# Patient Record
Sex: Male | Born: 1965
Health system: Southern US, Community
[De-identification: ages and names within clinical notes are randomized; demographics above are authoritative.]

## PROBLEM LIST (undated history)

## (undated) DIAGNOSIS — I639 Cerebral infarction, unspecified: Secondary | ICD-10-CM

## (undated) DIAGNOSIS — N189 Chronic kidney disease, unspecified: Secondary | ICD-10-CM

## (undated) DIAGNOSIS — I10 Essential (primary) hypertension: Secondary | ICD-10-CM

## (undated) DIAGNOSIS — G43909 Migraine, unspecified, not intractable, without status migrainosus: Secondary | ICD-10-CM

## (undated) DIAGNOSIS — I219 Acute myocardial infarction, unspecified: Secondary | ICD-10-CM

## (undated) DIAGNOSIS — R0600 Dyspnea, unspecified: Secondary | ICD-10-CM

## (undated) HISTORY — PX: HAND SURGERY: SHX662

## (undated) HISTORY — DX: Migraine, unspecified, not intractable, without status migrainosus: G43.909

---

## 2012-01-24 DIAGNOSIS — I639 Cerebral infarction, unspecified: Secondary | ICD-10-CM

## 2012-01-24 HISTORY — DX: Cerebral infarction, unspecified: I63.9

## 2016-01-10 ENCOUNTER — Other Ambulatory Visit: Payer: Self-pay | Admitting: Cardiology

## 2016-01-10 DIAGNOSIS — R079 Chest pain, unspecified: Secondary | ICD-10-CM

## 2016-01-12 ENCOUNTER — Ambulatory Visit (HOSPITAL_COMMUNITY)
Admission: RE | Admit: 2016-01-12 | Discharge: 2016-01-12 | Disposition: A | Discharge status: Home / Self Care | Payer: BLUE CROSS/BLUE SHIELD | Source: Ambulatory Visit | Attending: Cardiology | Admitting: Cardiology

## 2016-01-12 DIAGNOSIS — R079 Chest pain, unspecified: Secondary | ICD-10-CM | POA: Diagnosis present

## 2016-01-12 LAB — LIPID PANEL
CHOL/HDL RATIO: 3 ratio
CHOLESTEROL: 131 mg/dL (ref 0–200)
HDL: 43 mg/dL (ref >40)
LDL Cholesterol: 73 mg/dL (ref 0–99)
TRIGLYCERIDES: 77 mg/dL (ref <150)
VLDL: 15 mg/dL (ref 0–40)

## 2016-01-12 LAB — BASIC METABOLIC PANEL
ANION GAP: 5 (ref 5–15)
BUN: 12 mg/dL (ref 6–20)
CALCIUM: 9.4 mg/dL (ref 8.9–10.3)
CO2: 28 mmol/L (ref 22–32)
CREATININE: 1.49 mg/dL — AB (ref 0.61–1.24)
Chloride: 107 mmol/L (ref 101–111)
GFR, EST NON AFRICAN AMERICAN: 53 mL/min — AB (ref >60)
GLUCOSE: 115 mg/dL — AB (ref 65–99)
Potassium: 4.4 mmol/L (ref 3.5–5.1)
Sodium: 140 mmol/L (ref 135–145)

## 2016-01-12 LAB — HEPATIC FUNCTION PANEL
ALT: 35 U/L (ref 17–63)
AST: 25 U/L (ref 15–41)
Albumin: 4.2 g/dL (ref 3.5–5.0)
Alkaline Phosphatase: 69 U/L (ref 38–126)
BILIRUBIN DIRECT: 0.2 mg/dL (ref 0.1–0.5)
BILIRUBIN INDIRECT: 1.2 mg/dL — AB (ref 0.3–0.9)
TOTAL PROTEIN: 7.2 g/dL (ref 6.5–8.1)
Total Bilirubin: 1.4 mg/dL — ABNORMAL HIGH (ref 0.3–1.2)

## 2016-01-12 MED ORDER — TECHNETIUM TC 99M TETROFOSMIN IV KIT
30.0000 | PACK | Freq: Once | INTRAVENOUS | Status: AC | PRN
  Administered 2016-01-12: 30 via INTRAVENOUS

## 2016-01-12 MED ORDER — TECHNETIUM TC 99M TETROFOSMIN IV KIT
10.0000 | PACK | Freq: Once | INTRAVENOUS | Status: AC | PRN
  Administered 2016-01-12: 10 via INTRAVENOUS

## 2016-01-12 MED ORDER — REGADENOSON 0.4 MG/5ML IV SOLN
0.4000 mg | Freq: Once | INTRAVENOUS | Status: AC
  Administered 2016-01-12: 0.4 mg via INTRAVENOUS

## 2016-01-12 MED ORDER — REGADENOSON 0.4 MG/5ML IV SOLN
INTRAVENOUS | Status: AC
  Administered 2016-01-12: 0.4 mg via INTRAVENOUS
  Filled 2016-01-12: qty 5

## 2016-02-22 ENCOUNTER — Emergency Department (HOSPITAL_COMMUNITY): Payer: BLUE CROSS/BLUE SHIELD

## 2016-02-22 ENCOUNTER — Encounter (HOSPITAL_COMMUNITY): Payer: Self-pay | Admitting: Emergency Medicine

## 2016-02-22 ENCOUNTER — Emergency Department (HOSPITAL_COMMUNITY)
Admission: EM | Admit: 2016-02-22 | Discharge: 2016-02-22 | Disposition: A | Discharge status: Home / Self Care | Payer: BLUE CROSS/BLUE SHIELD | Attending: Emergency Medicine | Admitting: Emergency Medicine

## 2016-02-22 DIAGNOSIS — M545 Low back pain: Secondary | ICD-10-CM | POA: Insufficient documentation

## 2016-02-22 DIAGNOSIS — M25552 Pain in left hip: Secondary | ICD-10-CM | POA: Insufficient documentation

## 2016-02-22 DIAGNOSIS — Y999 Unspecified external cause status: Secondary | ICD-10-CM | POA: Insufficient documentation

## 2016-02-22 DIAGNOSIS — Y92410 Unspecified street and highway as the place of occurrence of the external cause: Secondary | ICD-10-CM | POA: Insufficient documentation

## 2016-02-22 DIAGNOSIS — I10 Essential (primary) hypertension: Secondary | ICD-10-CM | POA: Insufficient documentation

## 2016-02-22 DIAGNOSIS — M546 Pain in thoracic spine: Secondary | ICD-10-CM | POA: Insufficient documentation

## 2016-02-22 DIAGNOSIS — M25551 Pain in right hip: Secondary | ICD-10-CM | POA: Insufficient documentation

## 2016-02-22 DIAGNOSIS — Y939 Activity, unspecified: Secondary | ICD-10-CM | POA: Insufficient documentation

## 2016-02-22 DIAGNOSIS — R930 Abnormal findings on diagnostic imaging of skull and head, not elsewhere classified: Secondary | ICD-10-CM | POA: Insufficient documentation

## 2016-02-22 DIAGNOSIS — M549 Dorsalgia, unspecified: Secondary | ICD-10-CM | POA: Diagnosis present

## 2016-02-22 HISTORY — DX: Essential (primary) hypertension: I10

## 2016-02-22 LAB — I-STAT CHEM 8, ED
BUN: 13 mg/dL (ref 6–20)
Calcium, Ion: 1.17 mmol/L (ref 1.15–1.40)
Chloride: 106 mmol/L (ref 101–111)
Creatinine, Ser: 1.3 mg/dL — ABNORMAL HIGH (ref 0.61–1.24)
Glucose, Bld: 121 mg/dL — ABNORMAL HIGH (ref 65–99)
HCT: 46 % (ref 39.0–52.0)
Hemoglobin: 15.6 g/dL (ref 13.0–17.0)
Potassium: 4.5 mmol/L (ref 3.5–5.1)
Sodium: 142 mmol/L (ref 135–145)
TCO2: 25 mmol/L (ref 0–100)

## 2016-02-22 MED ORDER — METHOCARBAMOL 500 MG PO TABS: 500.0000 mg | ORAL_TABLET | Freq: Two times a day (BID) | ORAL | 0 refills | Status: DC

## 2016-02-22 MED ORDER — HYDROMORPHONE HCL 1 MG/ML IJ SOLN
1.0000 mg | Freq: Once | INTRAMUSCULAR | Status: AC
  Administered 2016-02-22: 1 mg via INTRAVENOUS
  Filled 2016-02-22: qty 1

## 2016-02-22 MED ORDER — MORPHINE SULFATE (PF) 4 MG/ML IV SOLN
4.0000 mg | Freq: Once | INTRAVENOUS | Status: AC
  Administered 2016-02-22: 4 mg via INTRAVENOUS
  Filled 2016-02-22: qty 1

## 2016-02-22 MED ORDER — OXYCODONE-ACETAMINOPHEN 5-325 MG PO TABS: 1.0000 | ORAL_TABLET | Freq: Four times a day (QID) | ORAL | 0 refills | Status: DC | PRN

## 2016-02-22 MED ORDER — IOPAMIDOL (ISOVUE-300) INJECTION 61%
INTRAVENOUS | Status: AC
  Administered 2016-02-22: 100 mL
  Filled 2016-02-22: qty 100

## 2016-02-22 MED ORDER — ONDANSETRON HCL 4 MG PO TABS: 4.0000 mg | ORAL_TABLET | Freq: Four times a day (QID) | ORAL | 0 refills | Status: DC
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
## 2016-02-22 NOTE — ED Notes (Signed)
Ambulate PT in hall. PT took slow steady steps with assist on wall. Pain 6/10. State pain went down to right leg and foot

## 2016-02-22 NOTE — ED Notes (Signed)
ED Provider at bedside. 
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
## 2016-02-22 NOTE — ED Notes (Signed)
Patient transported to X-ray 
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
## 2016-03-06 ENCOUNTER — Ambulatory Visit: Payer: BLUE CROSS/BLUE SHIELD | Attending: Family Medicine | Admitting: Physical Therapy

## 2016-03-06 ENCOUNTER — Encounter: Payer: Self-pay | Admitting: Physical Therapy

## 2016-03-06 DIAGNOSIS — M545 Low back pain: Secondary | ICD-10-CM | POA: Diagnosis present
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
## 2016-03-09 ENCOUNTER — Encounter: Payer: Self-pay | Admitting: Physical Therapy

## 2016-03-09 ENCOUNTER — Ambulatory Visit: Payer: BLUE CROSS/BLUE SHIELD | Admitting: Physical Therapy

## 2016-03-09 DIAGNOSIS — M545 Low back pain: Secondary | ICD-10-CM

## 2016-03-09 NOTE — Therapy (Signed)
[facility] Outpatient Rehabilitation [facility] [address] [place], [place], [address] Phone: [phone]   Fax:  [phone]  Physical Therapy Treatment  Patient Details  Name: Cesar Munoz MRN: [id number] Date of Birth: [birth year] Referring Provider: Maryelizabeth Rowan, MD  Encounter Date: 03/09/2016      PT End of Session - 03/09/16 0931    Visit Number 2   Number of Visits 13   Authorization Type BCBS   PT Start Time [phone]   PT Stop Time 1024   PT Time Calculation (min) 53 min   Activity Tolerance Patient tolerated treatment well   Behavior During Therapy [facility] for tasks assessed/performed      Past Medical History:  Diagnosis Date  . Hypertension     Past Surgical History:  Procedure Laterality Date  . HAND SURGERY Right     There were no vitals filed for this visit.      Subjective Assessment - 03/09/16 0931    Subjective pt reports being achey after last visit. Had a hard time sleeping last night. Has been trying to walk around neighborhood which increased stiffness. Work wants him to return as soon as possible.    Patient Stated Goals return to work   Currently in Pain? Yes   Pain Location Back   Pain Orientation Lower   Pain Descriptors / Indicators Aching            [facility] PT Assessment - 03/09/16 0001      ROM / Strength   AROM / PROM / Strength Strength     Strength   Strength Assessment Site Hip   Right/Left Hip Right;Left   Right Hip Flexion 4/5   Right Hip ABduction 4-/5   Left Hip Flexion 4/5   Left Hip ABduction 4-/5                     [facility] Adult PT Treatment/Exercise - 03/09/16 0001      Lumbar Exercises: Stretches   Passive Hamstring Stretch 2 reps;30 seconds  bilateral   Passive Hamstring Stretch Limitations supine with green strap   Lower Trunk Rotation Limitations x5 each side   Standing Side Bend Limitations QL stretch in door     Lumbar Exercises: Aerobic   Stationary Bike nu step L5 5 min      Lumbar Exercises: Machines for Strengthening   Leg Press 60lb x20     Lumbar Exercises: Supine   Bridge Limitations bridge with ball squeeze   Straight Leg Raise 15 reps   Straight Leg Raises Limitations bilateral, cues for core engagement     Lumbar Exercises: Sidelying   Hip Abduction 20 reps  bilateral     Modalities   Modalities Moist Heat     Moist Heat Therapy   Number Minutes Moist Heat 10 Minutes   Moist Heat Location Lumbar Spine                PT Education - 03/09/16 1108    Education provided Yes   Education Details exercise form/rationale, HEP   Person(s) Educated Patient   Methods Explanation;Demonstration;Tactile cues;Verbal cues;Handout   Comprehension Returned demonstration;Verbal cues required;Verbalized understanding;Tactile cues required;Need further instruction          PT Short Term Goals - 03/06/16 1305      PT SHORT TERM GOAL #1   Title Pt will demo proper abdominal engagment in all postures while exercising to provide proper support to lumbar spine by 3/9   Baseline  began educating at eval   Time 3   Period Weeks   Status New     PT SHORT TERM GOAL #2   Title Pt will verbalize independence with HEP as it has been established, readiness for progression   Baseline began establishing at eval   Time 3   Period Weeks   Status New           PT Long Term Goals - 03/06/16 1307      PT LONG TERM GOAL #1   Title Pt will be able to sit for at least 1 hour without limitations by back pain in order to return to work by 3/30   Baseline limited to only a few minutes   Time 6   Period Weeks   Status New     PT LONG TERM GOAL #2   Title Hip MMT 4+/5 grossly to indicate proper support to lumbar spine   Baseline will test at next visit   Time 6   Period Weeks   Status New     PT LONG TERM GOAL #3   Title Pt will be able to perform 10 sit to stand without break or interruption to improve functional mobility, no increase in low  back pain   Baseline requires use of hands and standing break to stand, very slow to sit   Time 6   Period Weeks   Status New     PT LONG TERM GOAL #4   Title Pt will demo proper pushing/pulling techniques with use of abdominal support in order to return to work   Baseline will progress into pushing/pulling activities as is appropriate   Time 6   Period Weeks   Status New               Plan - 03/09/16 9169    Clinical Impression Statement Pt verbalized fatigue and difficulty with exercises today. Focus to activate appropraite muscle groups to reduce overuse of lumbar muscualture with spasm. Notable hypertrophy of lumbar paraspinals and trigger points in bilateral QL indicating overuse. Will increase frequency of PT to 3/week.    PT Next Visit Plan seated on physioball with pulls   PT Home Exercise Plan single knee to chest, active hamstring stretch, lower trunk rotation, abdominal engagement   Consulted and Agree with Plan of Care Patient      Patient will benefit from skilled therapeutic intervention in order to improve the following deficits and impairments:     Visit Diagnosis: Acute bilateral low back pain, with sciatica presence unspecified     Problem List There are no active problems to display for this patient.  Kedar Sedano C. Johanan Skorupski PT, DPT 03/09/16 11:11 AM   [facility] Out[facility] Rehabilitation [facility] [address] [place], [place], [address] Phone: [phone]   Fax:  [phone]  Name: Cesar Munoz MRN: [id number] Date of Birth: 1965-04-23

## 2016-03-09 NOTE — Addendum Note (Signed)
Addended by: Army Fossa C on: 03/09/2016 11:14 AM   Modules accepted: Orders

## 2016-03-13 ENCOUNTER — Encounter: Payer: Self-pay | Admitting: Physical Therapy

## 2016-03-13 ENCOUNTER — Ambulatory Visit: Payer: BLUE CROSS/BLUE SHIELD | Admitting: Physical Therapy

## 2016-03-13 DIAGNOSIS — M545 Low back pain: Secondary | ICD-10-CM

## 2016-03-13 NOTE — Therapy (Signed)
[facility] Outpatient Rehabilitation [facility] [address] [place], [place], [address] Phone: [phone]   Fax:  [phone]  Physical Therapy Treatment  Patient Details  Name: Cesar Munoz MRN: [id number] Date of Birth: 27-Oct-1965 Referring Provider: Maryelizabeth Rowan, MD  Encounter Date: 03/13/2016      PT End of Session - 03/13/16 0839    Visit Number 3   Number of Visits 13   Date for PT Re-Evaluation 04/21/16   Authorization Type BCBS   PT Start Time 0839   PT Stop Time 0921   PT Time Calculation (min) 42 min   Activity Tolerance Patient tolerated treatment well   Behavior During Therapy [facility] for tasks assessed/performed      Past Medical History:  Diagnosis Date  . Hypertension     Past Surgical History:  Procedure Laterality Date  . HAND SURGERY Right     There were no vitals filed for this visit.      Subjective Assessment - 03/13/16 0839    Subjective pt reports back did okay over the weekend. Is going to help daughter so will not be here for apts the rest of the week.    Patient Stated Goals return to work   Currently in Pain? Yes   Pain Score 4    Pain Location Back   Pain Orientation Lower;Medial   Pain Descriptors / Indicators Tightness   Aggravating Factors  getting up in the morning, sitting in a chair   Pain Relieving Factors moving around                         [facility] Adult PT Treatment/Exercise - 03/13/16 0001      Exercises   Exercises Other Exercises;Knee/Hip     Lumbar Exercises: Stretches   Passive Hamstring Stretch 2 reps;30 seconds  both   Passive Hamstring Stretch Limitations supine with green strap     Lumbar Exercises: Aerobic   Stationary Bike nu step L5 5 min     Lumbar Exercises: Standing   Heel Raises 15 reps   Heel Raises Limitations with upper extremity ER green tband   Lifting Limitations lifting stool, cues for form/posture; golfer lift   Row Limitations single arm row blue tband  x20 each     Lumbar Exercises: Supine   Bridge 10 reps  5s holds   Bridge Limitations with ball squeeze     Knee/Hip Exercises: Stretches   Piriformis Stretch Limitations figure 4 30s each   Gastroc Stretch Both;2 reps;30 seconds   Gastroc Stretch Limitations slant board     Knee/Hip Exercises: Seated   Sit to [employer] 15 reps;without UE support  cues to not use hands                PT Education - 03/13/16 [phone]    Education provided Yes   Education Details exercise form/rationale, HEP   Person(s) Educated Patient   Methods Explanation;Demonstration;Tactile cues;Verbal cues;Handout   Comprehension Verbalized understanding;Returned demonstration;Verbal cues required;Tactile cues required;Need further instruction          PT Short Term Goals - 03/06/16 1305      PT SHORT TERM GOAL #1   Title Pt will demo proper abdominal engagment in all postures while exercising to provide proper support to lumbar spine by 3/9   Baseline began educating at eval   Time 3   Period Weeks   Status New     PT SHORT TERM GOAL #2  Title Pt will verbalize independence with HEP as it has been established, readiness for progression   Baseline began establishing at eval   Time 3   Period Weeks   Status New           PT Long Term Goals - 03/06/16 1307      PT LONG TERM GOAL #1   Title Pt will be able to sit for at least 1 hour without limitations by back pain in order to return to work by 3/30   Baseline limited to only a few minutes   Time 6   Period Weeks   Status New     PT LONG TERM GOAL #2   Title Hip MMT 4+/5 grossly to indicate proper support to lumbar spine   Baseline will test at next visit   Time 6   Period Weeks   Status New     PT LONG TERM GOAL #3   Title Pt will be able to perform 10 sit to stand without break or interruption to improve functional mobility, no increase in low back pain   Baseline requires use of hands and standing break to stand, very slow to  sit   Time 6   Period Weeks   Status New     PT LONG TERM GOAL #4   Title Pt will demo proper pushing/pulling techniques with use of abdominal support in order to return to work   Baseline will progress into pushing/pulling activities as is appropriate   Time 6   Period Weeks   Status New               Plan - 03/13/16 9629    Clinical Impression Statement Reviewed lifting posture today for heavier and lighter objects, cues required for engagement of core and gluts but pt was able to verbalize and demo proper lifting posture at the end of treatment. Will challenge heavier lifting at next visit.    PT Next Visit Plan seated on physioball with pulls, lifting   PT Home Exercise Plan single knee to chest, active hamstring stretch, lower trunk rotation, abdominal engagement, single arm row, heel raise+UE ER green tband, sit<>stand, golfers lift   Consulted and Agree with Plan of Care Patient      Patient will benefit from skilled therapeutic intervention in order to improve the following deficits and impairments:     Visit Diagnosis: Acute bilateral low back pain, with sciatica presence unspecified     Problem List There are no active problems to display for this patient.   Milt Coye C. Nakiyah Beverley PT, DPT 03/13/16 9:24 AM   [facility] Outpatient Rehabilitation [facility] [address] [place], [place], [address] Phone: [phone]   Fax:  [phone]  Name: Cesar Munoz MRN: [id number] Date of Birth: 06/19/1965

## 2016-03-15 ENCOUNTER — Ambulatory Visit: Payer: BLUE CROSS/BLUE SHIELD | Admitting: Physical Therapy

## 2016-03-16 ENCOUNTER — Ambulatory Visit: Payer: BLUE CROSS/BLUE SHIELD | Admitting: Physical Therapy

## 2016-03-17 ENCOUNTER — Encounter: Payer: BLUE CROSS/BLUE SHIELD | Admitting: Physical Therapy

## 2016-03-20 ENCOUNTER — Ambulatory Visit: Payer: BLUE CROSS/BLUE SHIELD | Admitting: Physical Therapy

## 2016-03-20 ENCOUNTER — Encounter: Payer: Self-pay | Admitting: Physical Therapy

## 2016-03-20 DIAGNOSIS — M545 Low back pain: Secondary | ICD-10-CM | POA: Diagnosis not present

## 2016-03-20 NOTE — Therapy (Signed)
[facility] Outpatient Rehabilitation [facility] [address] [place], [place], [address] Phone: [phone]   Fax:  [phone]  Physical Therapy Treatment  Patient Details  Name: Cesar Munoz MRN: [id number] Date of Birth: 10/13/1965 Referring Provider: Maryelizabeth Rowan, MD  Encounter Date: 03/20/2016      PT End of Session - 03/20/16 0844    Visit Number 4   Number of Visits 13   Date for PT Re-Evaluation 04/21/16   Authorization Type BCBS   PT Start Time 0845   PT Stop Time 0927   PT Time Calculation (min) 42 min   Activity Tolerance Patient tolerated treatment well   Behavior During Therapy [facility] for tasks assessed/performed      Past Medical History:  Diagnosis Date  . Hypertension     Past Surgical History:  Procedure Laterality Date  . HAND SURGERY Right     There were no vitals filed for this visit.      Subjective Assessment - 03/20/16 0844    Subjective pt reports moving a case of water from the grocery on Thursday, notices "relapse" of back pain on Friday. Reports he tried lifting the correct way. Did not do much on Friday- some walking around and using the heating pad.    Patient Stated Goals return to work   Currently in Pain? Yes   Pain Score 5    Pain Location Back   Pain Orientation Lower   Pain Descriptors / Indicators --  "a knot"   Aggravating Factors  lifting   Pain Relieving Factors heat pad                         [facility] Adult PT Treatment/Exercise - 03/20/16 0001      Lumbar Exercises: Stretches   Lower Trunk Rotation Limitations open book x5 each   Prone Mid Back Stretch Limitations child pose center & R/L bias; seated EOB flexion stretch     Lumbar Exercises: Standing   Row 20 reps;10 reps   Theraband Level (Row) Level 3 (Green)   Other Standing Lumbar Exercises side stepping with 3# lateral punches     Lumbar Exercises: Seated   Hip Flexion on Ball Limitations on physioball: bilat & single UE  raises, single arm ABD, march, bouncing     Lumbar Exercises: Prone   Other Prone Lumbar Exercises forehead on folded arms, upper trunk extension 3s holds     Lumbar Exercises: Quadruped   Single Arm Raise 10 reps   Single Arm Raises Limitations alt UE     Knee/Hip Exercises: Aerobic   Nustep L5 6 min     Knee/Hip Exercises: Seated   Sit to Sand 2 sets;10 reps  punching 3# weights overhead                PT Education - 03/20/16 0850    Education provided Yes   Education Details exercise form/rationale, importance of exercise when feeling stiff   Person(s) Educated Patient   Methods Explanation;Demonstration;Tactile cues;Verbal cues   Comprehension Verbalized understanding;Returned demonstration;Verbal cues required;Tactile cues required;Need further instruction          PT Short Term Goals - 03/06/16 1305      PT SHORT TERM GOAL #1   Title Pt will demo proper abdominal engagment in all postures while exercising to provide proper support to lumbar spine by 3/9   Baseline began educating at eval   Time 3   Period Weeks   Status  New     PT SHORT TERM GOAL #2   Title Pt will verbalize independence with HEP as it has been established, readiness for progression   Baseline began establishing at eval   Time 3   Period Weeks   Status New           PT Long Term Goals - 03/06/16 1307      PT LONG TERM GOAL #1   Title Pt will be able to sit for at least 1 hour without limitations by back pain in order to return to work by 3/30   Baseline limited to only a few minutes   Time 6   Period Weeks   Status New     PT LONG TERM GOAL #2   Title Hip MMT 4+/5 grossly to indicate proper support to lumbar spine   Baseline will test at next visit   Time 6   Period Weeks   Status New     PT LONG TERM GOAL #3   Title Pt will be able to perform 10 sit to stand without break or interruption to improve functional mobility, no increase in low back pain   Baseline requires  use of hands and standing break to stand, very slow to sit   Time 6   Period Weeks   Status New     PT LONG TERM GOAL #4   Title Pt will demo proper pushing/pulling techniques with use of abdominal support in order to return to work   Baseline will progress into pushing/pulling activities as is appropriate   Time 6   Period Weeks   Status New               Plan - 03/20/16 1610    Clinical Impression Statement pt arrived ambulating with a very stiff gait and shuffling his feet, slightly flexed posture at hips. Notable weakness in extensor endurance- significant difficulty with 3s holds in prone extension. Poor ability to maintain a qped postiion with lifting of a single arm. Will continue to challenge postural strength and endurance. Pt left therapy presenting with an upright posture and no shuffling without cuing.    PT Next Visit Plan seated on physioball with pulls, lifting   PT Home Exercise Plan single knee to chest, active hamstring stretch, lower trunk rotation, abdominal engagement, single arm row, heel raise+UE ER green tband, sit<>stand, golfers lift   Consulted and Agree with Plan of Care Patient      Patient will benefit from skilled therapeutic intervention in order to improve the following deficits and impairments:     Visit Diagnosis: Acute bilateral low back pain, with sciatica presence unspecified     Problem List There are no active problems to display for this patient.  Anayiah Howden C. Taleeyah Bora PT, DPT 03/20/16 9:36 AM   [facility] [address] [place], [place], [address] Phone: [phone]   Fax:  [phone]  Name: Cesar Munoz MRN: [id number] Date of Birth: [birth year]

## 2016-03-22 ENCOUNTER — Ambulatory Visit: Payer: BLUE CROSS/BLUE SHIELD | Admitting: Physical Therapy

## 2016-03-23 ENCOUNTER — Encounter: Payer: BLUE CROSS/BLUE SHIELD | Admitting: Physical Therapy

## 2016-03-23 ENCOUNTER — Ambulatory Visit: Payer: BLUE CROSS/BLUE SHIELD | Attending: Family Medicine | Admitting: Physical Therapy

## 2016-03-23 ENCOUNTER — Encounter: Payer: Self-pay | Admitting: Physical Therapy

## 2016-03-23 DIAGNOSIS — M545 Low back pain: Secondary | ICD-10-CM

## 2016-03-23 NOTE — Therapy (Signed)
[facility] Outpatient Rehabilitation [facility] [address] [place], [place], [address] Phone: [phone]   Fax:  [phone]  Physical Therapy Treatment  Patient Details  Name: Cesar Munoz MRN: [id number] Date of Birth: Oct 10, 1965 Referring Provider: Maryelizabeth Rowan, MD  Encounter Date: 03/23/2016      PT End of Session - 03/23/16 0852    Visit Number 5   Number of Visits 13   Date for PT Re-Evaluation 04/21/16   Authorization Type BCBS   PT Start Time 0845   PT Stop Time 0927   PT Time Calculation (min) 42 min   Activity Tolerance Patient tolerated treatment well   Behavior During Therapy [facility] for tasks assessed/performed      Past Medical History:  Diagnosis Date  . Hypertension     Past Surgical History:  Procedure Laterality Date  . HAND SURGERY Right     There were no vitals filed for this visit.      Subjective Assessment - 03/23/16 0848    Subjective pt reports difficulty sleeping, back feels like it is in a knot but is slightly better when he is moving around. Notices throbbing today due to weather. Some aching after last visit but did not take muscle relaxer, trying to stop taking them.    Patient Stated Goals return to work   Currently in Pain? Yes   Pain Score 5    Pain Location Back   Pain Orientation Lower;Mid   Pain Descriptors / Indicators Throbbing   Aggravating Factors  sitting, laying down   Pain Relieving Factors head pad, icy hot                         [facility] Adult PT Treatment/Exercise - 03/23/16 0001      Lumbar Exercises: Stretches   Single Knee to Chest Stretch Limitations 30s R   Lower Trunk Rotation Limitations bilat x3     Lumbar Exercises: Aerobic   Stationary Bike nu step L5 7 min, L6 10 min     Lumbar Exercises: Supine   Bridge 15 reps   Bridge Limitations cues for abdominal engagement     Knee/Hip Exercises: Sidelying   Clams x20 each green tband     Manual Therapy   Manual Therapy  Myofascial release   Myofascial Release trigger point release & soft tissue to R QL, lumbar paraspinals                PT Education - 03/23/16 1117    Education provided Yes   Education Details exercise form/rationale, stretching at home, sleeping posture, anatomy of trigger points   Person(s) Educated Patient   Methods Explanation;Demonstration;Tactile cues;Verbal cues;Handout   Comprehension Verbalized understanding;Returned demonstration;Verbal cues required;Tactile cues required;Need further instruction          PT Short Term Goals - 03/06/16 1305      PT SHORT TERM GOAL #1   Title Pt will demo proper abdominal engagment in all postures while exercising to provide proper support to lumbar spine by 3/9   Baseline began educating at eval   Time 3   Period Weeks   Status New     PT SHORT TERM GOAL #2   Title Pt will verbalize independence with HEP as it has been established, readiness for progression   Baseline began establishing at eval   Time 3   Period Weeks   Status New           PT Long  Term Goals - 03/06/16 1307      PT LONG TERM GOAL #1   Title Pt will be able to sit for at least 1 hour without limitations by back pain in order to return to work by 3/30   Baseline limited to only a few minutes   Time 6   Period Weeks   Status New     PT LONG TERM GOAL #2   Title Hip MMT 4+/5 grossly to indicate proper support to lumbar spine   Baseline will test at next visit   Time 6   Period Weeks   Status New     PT LONG TERM GOAL #3   Title Pt will be able to perform 10 sit to stand without break or interruption to improve functional mobility, no increase in low back pain   Baseline requires use of hands and standing break to stand, very slow to sit   Time 6   Period Weeks   Status New     PT LONG TERM GOAL #4   Title Pt will demo proper pushing/pulling techniques with use of abdominal support in order to return to work   Baseline will progress into  pushing/pulling activities as is appropriate   Time 6   Period Weeks   Status New               Plan - 03/23/16 0920    Clinical Impression Statement concordant pain noted in trigger points found in R QL and paraspinals. educated pt on importance of utilizing gluts and core rather than lower back for posture. discussed stretching and sleeping posture at home to decrease return of trigger points.    PT Next Visit Plan seated on physioball with pulls, lifting; trigger point release prn   PT Home Exercise Plan single knee to chest, active hamstring stretch, lower trunk rotation, abdominal engagement, single arm row, heel raise+UE ER green tband, sit<>stand, golfers lift   Consulted and Agree with Plan of Care Patient      Patient will benefit from skilled therapeutic intervention in order to improve the following deficits and impairments:     Visit Diagnosis: Acute bilateral low back pain, with sciatica presence unspecified     Problem List There are no active problems to display for this patient.   Lovey Crupi C. Kyliana Standen PT, DPT 03/23/16 9:28 AM   [facility] Outpatient Rehabilitation [facility] [address] [place], [place], [address] Phone: [phone]   Fax:  [phone]  Name: Cesar Munoz MRN: [id number] Date of Birth: 1965/06/28

## 2016-03-24 ENCOUNTER — Encounter: Payer: BLUE CROSS/BLUE SHIELD | Admitting: Physical Therapy

## 2016-03-30 ENCOUNTER — Ambulatory Visit: Payer: BLUE CROSS/BLUE SHIELD | Admitting: Physical Therapy

## 2016-03-30 DIAGNOSIS — M545 Low back pain: Secondary | ICD-10-CM | POA: Diagnosis not present

## 2016-03-30 NOTE — Therapy (Signed)
[facility] [address] [place], [place], [address] Phone: [phone]   Fax:  [phone]  Physical Therapy Treatment  Patient Details  Name: Cesar Munoz MRN: [id number] Date of Birth: 11/15/1965 Referring Provider: Maryelizabeth Rowan, MD  Encounter Date: 03/30/2016      PT End of Session - 03/30/16 0808    Visit Number 6   Number of Visits 13   Date for PT Re-Evaluation 04/21/16   Authorization Type BCBS   PT Start Time 0802   PT Stop Time 0905   PT Time Calculation (min) 63 min      Past Medical History:  Diagnosis Date  . Hypertension     Past Surgical History:  Procedure Laterality Date  . HAND SURGERY Right     There were no vitals filed for this visit.      Subjective Assessment - 03/30/16 0909    Subjective I lifted about 6 bags of 50 lb fertilizer. Made my back hurt more.    Currently in Pain? Yes   Pain Score 6    Pain Location Back   Pain Orientation Left;Mid;Right   Aggravating Factors  prolonged sitting   Pain Relieving Factors heat, icy hot                          [facility] Adult PT Treatment/Exercise - 03/30/16 0001      Lumbar Exercises: Stretches   Lower Trunk Rotation Limitations open book x5 each     Lumbar Exercises: Aerobic   Stationary Bike nu step L5 6 min     Lumbar Exercises: Supine   Ab Set 5 reps   Clam 10 reps   Clam Limitations cues for abdominal engagement    Bent Knee Raise 10 reps   Bent Knee Raise Limitations cues with abdominal engagement    Bridge 15 reps  with posterior pelvic tilt    Bridge Limitations cues for abdominal engagement     Knee/Hip Exercises: Sidelying   Clams x20 each green tband     Modalities   Modalities Electrical Stimulation;Moist Heat     Moist Heat Therapy   Number Minutes Moist Heat 15 Minutes   Moist Heat Location Lumbar Spine     Electrical Stimulation   Electrical Stimulation Location right lumbar    Electrical  Stimulation Action IFC x 15    Electrical Stimulation Parameters 11 ma   Electrical Stimulation Goals Pain     Manual Therapy   Myofascial Release trigger point release & soft tissue to R QL, lumbar paraspinals                  PT Short Term Goals - 03/06/16 1305      PT SHORT TERM GOAL #1   Title Pt will demo proper abdominal engagment in all postures while exercising to provide proper support to lumbar spine by 3/9   Baseline began educating at eval   Time 3   Period Weeks   Status New     PT SHORT TERM GOAL #2   Title Pt will verbalize independence with HEP as it has been established, readiness for progression   Baseline began establishing at eval   Time 3   Period Weeks   Status New           PT Long Term Goals - 03/06/16 1307      PT LONG TERM GOAL #1   Title Pt will be able  to sit for at least 1 hour without limitations by back pain in order to return to work by 3/30   Baseline limited to only a few minutes   Time 6   Period Weeks   Status New     PT LONG TERM GOAL #2   Title Hip MMT 4+/5 grossly to indicate proper support to lumbar spine   Baseline will test at next visit   Time 6   Period Weeks   Status New     PT LONG TERM GOAL #3   Title Pt will be able to perform 10 sit to stand without break or interruption to improve functional mobility, no increase in low back pain   Baseline requires use of hands and standing break to stand, very slow to sit   Time 6   Period Weeks   Status New     PT LONG TERM GOAL #4   Title Pt will demo proper pushing/pulling techniques with use of abdominal support in order to return to work   Baseline will progress into pushing/pulling activities as is appropriate   Time 6   Period Weeks   Status New               Plan - 03/30/16 0910    Clinical Impression Statement Pt reports increased pain after lifting heavy fertilizer. Continued pain with prolonged sitting for driving. Advised pt to stop and  stretch during upcoming road trip. Worked on lumbar stabilization without incrreased lumbar pain. Performed more soft tissue work to Right QL and lumar paraspinals with pt reporting he is very tender. Trial of HMP and IFC to right lumbar. Pt reports decreased apain and requested info about purchasing a TENS unit.    PT Next Visit Plan seated on physioball with pulls, lifting; trigger point release prn; assess IFC   PT Home Exercise Plan single knee to chest, active hamstring stretch, lower trunk rotation, abdominal engagement, single arm row, heel raise+UE ER green tband, sit<>stand, golfers lift   Consulted and Agree with Plan of Care Patient      Patient will benefit from skilled therapeutic intervention in order to improve the following deficits and impairments:  Difficulty walking, Increased muscle spasms, Decreased activity tolerance, Pain, Decreased endurance, Improper body mechanics, Postural dysfunction, Decreased mobility  Visit Diagnosis: Acute bilateral low back pain, with sciatica presence unspecified     Problem List There are no active problems to display for this patient.   Jannette Spanner [place], [place] 03/30/2016, 9:21 AM  [facility] [address] [place], [place], [address] Phone: [phone]   Fax:  [phone]  Name: Cesar Munoz MRN: [id number] Date of Birth: Feb 04, 1965

## 2016-03-31 ENCOUNTER — Encounter: Payer: BLUE CROSS/BLUE SHIELD | Admitting: Physical Therapy

## 2016-04-05 ENCOUNTER — Ambulatory Visit: Payer: BLUE CROSS/BLUE SHIELD | Admitting: Physical Therapy

## 2016-04-06 ENCOUNTER — Ambulatory Visit: Payer: BLUE CROSS/BLUE SHIELD | Admitting: Physical Therapy

## 2016-04-06 DIAGNOSIS — M545 Low back pain: Secondary | ICD-10-CM

## 2016-04-06 NOTE — Therapy (Signed)
[place], [place], [address] Phone: [phone]   Fax:  [phone]  Physical Therapy Treatment  Patient Details  Name: Cesar Munoz MRN: [id number] Date of Birth: 02/19/1965 Referring Provider: Rachell Cipro, MD  Encounter Date: 04/06/2016      PT End of Session - 04/06/16 0825    Visit Number 7   Number of Visits 13   Date for PT Re-Evaluation 04/21/16   Authorization Type BCBS   PT Start Time 0820  20 minutes late    PT Stop Time 0845   PT Time Calculation (min) 25 min      Past Medical History:  Diagnosis Date  . Hypertension     Past Surgical History:  Procedure Laterality Date  . HAND SURGERY Right     There were no vitals filed for this visit.      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     Currently in Pain? Yes   Pain Score 6    Pain Location Back   Pain Orientation Left;Right;Mid   Pain Descriptors / Indicators Aching;Sore;Throbbing   Aggravating Factors  painting    Pain Relieving Factors heat, icy hot, rest             [facility] PT Assessment - 04/06/16 0001      Strength   Right Hip Flexion 4/5   Right Hip ABduction 4/5   Left Hip Flexion 4+/5   Left Hip ABduction 4/5                     [facility] Adult PT Treatment/Exercise - 04/06/16 0001      Lumbar Exercises: Stretches   Single Knee to Chest Stretch Limitations 30s R/L   Lower Trunk Rotation Limitations bilat x3     Lumbar Exercises: Aerobic   Stationary Bike nu step L5 7 min     Lumbar Exercises: Supine   Ab Set 5 reps   Bent Knee Raise 10 reps   Bent Knee Raise Limitations cues with abdominal engagement , level 2    Bridge 15 reps  with posterior pelvic tilt    Bridge Limitations cues for abdominal engagement                  PT Short Term Goals - 04/06/16 1328      PT SHORT TERM GOAL #1   Title Pt will demo  proper abdominal engagment in all postures while exercising to provide proper support to lumbar spine by 3/9   Baseline does well in supine   Time 3   Period Weeks   Status Partially Met     PT SHORT TERM GOAL #2   Title Pt will verbalize independence with HEP as it has been established, readiness for progression   Baseline performs some of his HEP    Time 3   Period Weeks   Status Partially Met           PT Long Term Goals - 04/06/16 0841      PT LONG TERM GOAL #1   Title Pt will be able to sit for at least 1 hour without limitations by back pain in order to return to work by 3/30   Baseline 30 minutes   Time 6   Period Weeks   Status On-going     PT LONG TERM GOAL #2   Title Hip MMT 4+/5  grossly to indicate proper support to lumbar spine   Baseline 4/5 grossly    Time 6   Period Weeks   Status Partially Met     PT LONG TERM GOAL #3   Title Pt will be able to perform 10 sit to stand without break or interruption to improve functional mobility, no increase in low back pain   Baseline Fatigued, no increased pain, 10 sit to stands without break, hands on kneees   Time 6   Period Weeks   Status Achieved     PT LONG TERM GOAL #4   Title Pt will demo proper pushing/pulling techniques with use of abdominal support in order to return to work   Time 6   Period Weeks   Status Unable to assess               Plan - 04/06/16 1325    Clinical Impression Statement Pt 20 minutes late for treatment. He is concerned that liability coverage for his PT has maxed out and they will not pay for any more visits. He may decide to cancel his next visits after seeing MD. He reports still having a knot in lumbar spine and pain after prolonged sitting. He can perform 10 sit-stands without c/o pain. His hip strength has improved to 4/5 grossly. LTG#2 partially met, #3 met.    PT Next Visit Plan seated on physioball with pulls, lifting; trigger point release prn; assess IFC   PT Home  Exercise Plan single knee to chest, active hamstring stretch, lower trunk rotation, abdominal engagement, single arm row, heel raise+UE ER green tband, sit<>stand, golfers lift   Consulted and Agree with Plan of Care Patient      Patient will benefit from skilled therapeutic intervention in order to improve the following deficits and impairments:  Difficulty walking, Increased muscle spasms, Decreased activity tolerance, Pain, Decreased endurance, Improper body mechanics, Postural dysfunction, Decreased mobility  Visit Diagnosis: Acute bilateral low back pain, with sciatica presence unspecified     Problem List There are no active problems to display for this patient.   Dorene Ar, [place] 04/06/2016, 1:33 PM  [place], [place], [address] Phone: [phone]   Fax:  [phone]  Name: Cesar Munoz MRN: [id number] Date of Birth: Aug 24, 1965

## 2016-04-07 ENCOUNTER — Encounter: Payer: BLUE CROSS/BLUE SHIELD | Admitting: Physical Therapy

## 2016-04-13 ENCOUNTER — Ambulatory Visit: Payer: BLUE CROSS/BLUE SHIELD | Admitting: Physical Therapy

## 2016-04-13 DIAGNOSIS — M545 Low back pain: Secondary | ICD-10-CM | POA: Diagnosis not present

## 2016-04-13 NOTE — Therapy (Signed)
[place], [place], [address] Phone: [phone]   Fax:  [phone]  Physical Therapy Treatment  Patient Details  Name: Cesar Munoz MRN: [id number] Date of Birth: 01/03/1966 Referring Provider: Rachell Cipro, MD  Encounter Date: 04/13/2016      PT End of Session - 04/13/16 0836    Visit Number 8   Number of Visits 13   Date for PT Re-Evaluation 04/21/16   Authorization Type BCBS   PT Start Time 0806   PT Stop Time 0845   PT Time Calculation (min) 39 min   Activity Tolerance Patient tolerated treatment well   Behavior During Therapy [facility] for tasks assessed/performed      Past Medical History:  Diagnosis Date  . Hypertension     Past Surgical History:  Procedure Laterality Date  . HAND SURGERY Right     There were no vitals filed for this visit.      Subjective Assessment - 04/13/16 0808    Subjective They want me to go back to work.    Currently in Pain? Yes   Pain Score 7    Pain Location Back   Pain Descriptors / Indicators Aching;Sore;Throbbing   Aggravating Factors  sitting, driving, reaching over head    Pain Relieving Factors heat, icy hot, rest                          [facility] Adult PT Treatment/Exercise - 04/13/16 0001      Lumbar Exercises: Stretches   Passive Hamstring Stretch 3 reps;30 seconds   Single Knee to Chest Stretch Limitations 30s R/L   Lower Trunk Rotation Limitations bilat x3     Lumbar Exercises: Seated   Hip Flexion on Ball Limitations on balance disc, pelvic tilits, neutral with LAQ, marching, Rows with green band, Shoulder extension with green band      Lumbar Exercises: Supine   Bent Knee Raise 10 reps   Bent Knee Raise Limitations cues with abdominal engagement , level 2    Bridge 10 reps  5s holds   Bridge Limitations with ball squeeze                  PT Short Term Goals - 04/13/16 0839      PT SHORT TERM GOAL #1    Title Pt will demo proper abdominal engagment in all postures while exercising to provide proper support to lumbar spine by 3/9   Time 3   Period Weeks   Status Achieved     PT SHORT TERM GOAL #2   Title Pt will verbalize independence with HEP as it has been established, readiness for progression   Baseline performs some of his HEP    Time 3   Period Weeks   Status Partially Met           PT Long Term Goals - 04/13/16 0840      PT LONG TERM GOAL #1   Title Pt will be able to sit for at least 1 hour without limitations by back pain in order to return to work by 3/30   Baseline 4 minutes    Time 6   Period Weeks   Status On-going     PT LONG TERM GOAL #2   Title Hip MMT 4+/5 grossly to indicate proper support to lumbar spine   Baseline 4/5 grossly    Time 6   Period Weeks   Status  Partially Met     PT LONG TERM GOAL #3   Title Pt will be able to perform 10 sit to stand without break or interruption to improve functional mobility, no increase in low back pain   Time 6   Period Weeks   Status Achieved     PT LONG TERM GOAL #4   Title Pt will demo proper pushing/pulling techniques with use of abdominal support in order to return to work   Baseline increased pain with manual push mower    Time 6   Period Weeks   Status On-going               Plan - 04/13/16 0834    Clinical Impression Statement Mr. Mcgrail is able to sit 45 minutes prior to increased pain. 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 He sees MD on Tuesday to discuss return to work. He reports decreased pain after IFC treatment and is interested in home TENS.    PT Next Visit Plan seated on physioball with pulls, lifting; trigger point release prn; assess IFC; issue home tens    PT Home Exercise Plan single knee to chest, active hamstring stretch, lower trunk rotation, abdominal engagement, single arm row, heel raise+UE ER green tband, sit<>stand, golfers lift    Consulted and Agree with Plan of Care Patient      Patient will benefit from skilled therapeutic intervention in order to improve the following deficits and impairments:  Difficulty walking, Increased muscle spasms, Decreased activity tolerance, Pain, Decreased endurance, Improper body mechanics, Postural dysfunction, Decreased mobility  Visit Diagnosis: Acute bilateral low back pain, with sciatica presence unspecified     Problem List There are no active problems to display for this patient.   Dorene Ar, [place] 04/13/2016, 11:25 AM  [facility] [address] [place], [place], [address] Phone: [phone]   Fax:  [phone]  Name: Lennie Vasco MRN: [id number] Date of Birth: [birth year]

## 2016-04-14 ENCOUNTER — Encounter: Payer: BLUE CROSS/BLUE SHIELD | Admitting: Physical Therapy

## 2016-04-18 ENCOUNTER — Ambulatory Visit: Payer: BLUE CROSS/BLUE SHIELD | Admitting: Physical Therapy

## 2016-04-18 DIAGNOSIS — M545 Low back pain: Secondary | ICD-10-CM | POA: Diagnosis not present

## 2016-04-18 NOTE — Therapy (Addendum)
[place], [place], [address] Phone: [phone]   Fax:  [phone]  Physical Therapy Treatment/Discharge Summary  Patient Details  Name: Cesar Munoz MRN: [id number] Date of Birth: [birth year] Referring Provider: Rachell Cipro, MD  Encounter Date: 04/18/2016      PT End of Session - 04/18/16 1155    Visit Number 9   Number of Visits 13   Date for PT Re-Evaluation 04/21/16   Authorization Type BCBS   PT Start Time 2229   PT Stop Time 0925   PT Time Calculation (min) 38 min      Past Medical History:  Diagnosis Date  . Hypertension     Past Surgical History:  Procedure Laterality Date  . HAND SURGERY Right     There were no vitals filed for this visit.      Subjective Assessment - 04/18/16 1154    Subjective Trying to do some side jobs to make money but it causes back pain because I have to lift heavy things.    Currently in Pain? Yes   Pain Score 5    Pain Location Back   Pain Orientation Left;Right;Mid   Pain Descriptors / Indicators Aching;Sore;Throbbing   Aggravating Factors  sitting, driving, reaching over head, lifting    Pain Relieving Factors heat, icy hot, TENS, rest                          [facility] Adult PT Treatment/Exercise - 04/18/16 0001      Self-Care   Self-Care Other Self-Care Comments   Other Self-Care Comments  TENS -precautions/ contraindications, application and use of home TENS -pt able to return demonstrate proper donning/doffing of electrodes to lumbar spine.      Lumbar Exercises: Aerobic   Stationary Bike nu step L5 5 min     Electrical Stimulation   Electrical Stimulation Location Bil Lumber   Electrical Stimulation Action TENS, Normal Mode    Electrical Stimulation Parameters to tolerance   Electrical Stimulation Goals Pain                  PT Short Term Goals - 04/13/16 0839      PT SHORT TERM GOAL #1   Title Pt will  demo proper abdominal engagment in all postures while exercising to provide proper support to lumbar spine by 3/9   Time 3   Period Weeks   Status Achieved     PT SHORT TERM GOAL #2   Title Pt will verbalize independence with HEP as it has been established, readiness for progression   Baseline performs some of his HEP    Time 3   Period Weeks   Status Partially Met           PT Long Term Goals - 04/13/16 0840      PT LONG TERM GOAL #1   Title Pt will be able to sit for at least 1 hour without limitations by back pain in order to return to work by 3/30   Baseline 4 minutes    Time 6   Period Weeks   Status On-going     PT LONG TERM GOAL #2   Title Hip MMT 4+/5 grossly to indicate proper support to lumbar spine   Baseline 4/5 grossly    Time 6   Period Weeks   Status Partially Met     PT LONG TERM GOAL #3   Title Pt  will be able to perform 10 sit to stand without break or interruption to improve functional mobility, no increase in low back pain   Time 6   Period Weeks   Status Achieved     PT LONG TERM GOAL #4   Title Pt will demo proper pushing/pulling techniques with use of abdominal support in order to return to work   Baseline increased pain with manual push mower    Time 6   Period Weeks   Status On-going               Plan - 04/18/16 1210    Clinical Impression Statement Session spent issuing home TENS and educating on contraindications, precautions, application and use. Pt sees MD tomorrow for follow up and to discuss return to work. 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 He may continue PT however this will depend on how his MD appointment goes. He will call to schedule more appointments or to request discharge after MD appointment.    PT Next Visit Plan seated on physioball with pulls, lifting; trigger point release prn; assess IFC; answer any HOME TENS questions    PT Home  Exercise Plan single knee to chest, active hamstring stretch, lower trunk rotation, abdominal engagement, single arm row, heel raise+UE ER green tband, sit<>stand, golfers lift   Consulted and Agree with Plan of Care Patient      Patient will benefit from skilled therapeutic intervention in order to improve the following deficits and impairments:  Difficulty walking, Increased muscle spasms, Decreased activity tolerance, Pain, Decreased endurance, Improper body mechanics, Postural dysfunction, Decreased mobility  Visit Diagnosis: Acute bilateral low back pain, with sciatica presence unspecified     Problem List There are no active problems to display for this patient.   Dorene Ar, [place] 04/18/2016, 12:13 PM  [place], [place], [address] Phone: [phone]   Fax:  [phone]  Name: Cesar Munoz MRN: [id number] Date of Birth: 1965-12-21   PHYSICAL THERAPY DISCHARGE SUMMARY  Visits from Start of Care: 9  Current functional level related to goals / functional outcomes: See above   Remaining deficits: See above   Education / Equipment: Anatomy of condition, POC, HEP, exercise form/rationale  Plan: Patient agrees to discharge.  Patient goals were partially met. Patient is being discharged due to not returning since the last visit.  ?????     Cesar Munoz PT, DPT 07/05/16 1:18 PM

## 2016-04-19 ENCOUNTER — Ambulatory Visit: Payer: BLUE CROSS/BLUE SHIELD | Admitting: Physical Therapy

## 2016-08-09 ENCOUNTER — Ambulatory Visit (INDEPENDENT_AMBULATORY_CARE_PROVIDER_SITE_OTHER): Payer: Self-pay

## 2016-08-09 ENCOUNTER — Other Ambulatory Visit: Payer: Self-pay | Admitting: Emergency Medicine

## 2016-08-09 DIAGNOSIS — Z021 Encounter for pre-employment examination: Secondary | ICD-10-CM

## 2016-08-09 DIAGNOSIS — I10 Essential (primary) hypertension: Secondary | ICD-10-CM

## 2016-11-27 DIAGNOSIS — M791 Myalgia, unspecified site: Secondary | ICD-10-CM | POA: Diagnosis not present

## 2016-11-28 DIAGNOSIS — Z13220 Encounter for screening for lipoid disorders: Secondary | ICD-10-CM | POA: Diagnosis not present

## 2016-11-28 DIAGNOSIS — Z1329 Encounter for screening for other suspected endocrine disorder: Secondary | ICD-10-CM | POA: Diagnosis not present

## 2016-11-28 DIAGNOSIS — Z0000 Encounter for general adult medical examination without abnormal findings: Secondary | ICD-10-CM | POA: Diagnosis not present

## 2016-11-28 DIAGNOSIS — Z125 Encounter for screening for malignant neoplasm of prostate: Secondary | ICD-10-CM | POA: Diagnosis not present

## 2016-11-28 DIAGNOSIS — Z114 Encounter for screening for human immunodeficiency virus [HIV]: Secondary | ICD-10-CM | POA: Diagnosis not present

## 2016-12-01 DIAGNOSIS — E782 Mixed hyperlipidemia: Secondary | ICD-10-CM | POA: Diagnosis not present

## 2016-12-01 DIAGNOSIS — R7303 Prediabetes: Secondary | ICD-10-CM | POA: Diagnosis not present

## 2016-12-01 DIAGNOSIS — Z0000 Encounter for general adult medical examination without abnormal findings: Secondary | ICD-10-CM | POA: Diagnosis not present

## 2016-12-01 DIAGNOSIS — J302 Other seasonal allergic rhinitis: Secondary | ICD-10-CM | POA: Diagnosis not present

## 2016-12-01 DIAGNOSIS — I10 Essential (primary) hypertension: Secondary | ICD-10-CM | POA: Diagnosis not present

## 2016-12-14 ENCOUNTER — Encounter (HOSPITAL_COMMUNITY): Payer: Self-pay | Admitting: Emergency Medicine

## 2016-12-14 ENCOUNTER — Emergency Department (HOSPITAL_COMMUNITY): Payer: BLUE CROSS/BLUE SHIELD

## 2016-12-14 ENCOUNTER — Emergency Department (HOSPITAL_COMMUNITY)
Admission: EM | Admit: 2016-12-14 | Discharge: 2016-12-14 | Disposition: A | Discharge status: Home / Self Care | Payer: BLUE CROSS/BLUE SHIELD | Attending: Emergency Medicine | Admitting: Emergency Medicine

## 2016-12-14 DIAGNOSIS — R202 Paresthesia of skin: Secondary | ICD-10-CM | POA: Diagnosis not present

## 2016-12-14 DIAGNOSIS — Z79899 Other long term (current) drug therapy: Secondary | ICD-10-CM | POA: Diagnosis not present

## 2016-12-14 DIAGNOSIS — R51 Headache: Secondary | ICD-10-CM | POA: Diagnosis not present

## 2016-12-14 DIAGNOSIS — R531 Weakness: Secondary | ICD-10-CM | POA: Diagnosis not present

## 2016-12-14 DIAGNOSIS — I10 Essential (primary) hypertension: Secondary | ICD-10-CM | POA: Diagnosis not present

## 2016-12-14 DIAGNOSIS — H538 Other visual disturbances: Secondary | ICD-10-CM | POA: Diagnosis not present

## 2016-12-14 DIAGNOSIS — I639 Cerebral infarction, unspecified: Secondary | ICD-10-CM | POA: Diagnosis not present

## 2016-12-14 HISTORY — DX: Acute myocardial infarction, unspecified: I21.9

## 2016-12-14 HISTORY — DX: Cerebral infarction, unspecified: I63.9

## 2016-12-14 LAB — COMPREHENSIVE METABOLIC PANEL
ALT: 26 U/L (ref 17–63)
AST: 21 U/L (ref 15–41)
Albumin: 4.5 g/dL (ref 3.5–5.0)
Alkaline Phosphatase: 94 U/L (ref 38–126)
Anion gap: 7 (ref 5–15)
BILIRUBIN TOTAL: 0.7 mg/dL (ref 0.3–1.2)
BUN: 15 mg/dL (ref 6–20)
CO2: 27 mmol/L (ref 22–32)
CREATININE: 1.42 mg/dL — AB (ref 0.61–1.24)
Calcium: 9.7 mg/dL (ref 8.9–10.3)
Chloride: 105 mmol/L (ref 101–111)
GFR calc Af Amer: >60 mL/min (ref >60)
GFR, EST NON AFRICAN AMERICAN: 55 mL/min — AB (ref >60)
Glucose, Bld: 131 mg/dL — ABNORMAL HIGH (ref 65–99)
POTASSIUM: 4.5 mmol/L (ref 3.5–5.1)
Sodium: 139 mmol/L (ref 135–145)
TOTAL PROTEIN: 7.9 g/dL (ref 6.5–8.1)

## 2016-12-14 LAB — URINALYSIS, ROUTINE W REFLEX MICROSCOPIC
BILIRUBIN URINE: NEGATIVE
GLUCOSE, UA: NEGATIVE mg/dL
Hgb urine dipstick: NEGATIVE
KETONES UR: NEGATIVE mg/dL
Leukocytes, UA: NEGATIVE
Nitrite: NEGATIVE
PH: 7 (ref 5.0–8.0)
Protein, ur: NEGATIVE mg/dL
SPECIFIC GRAVITY, URINE: 1.009 (ref 1.005–1.030)

## 2016-12-14 LAB — CBC
HEMATOCRIT: 44.3 % (ref 39.0–52.0)
HEMOGLOBIN: 15.5 g/dL (ref 13.0–17.0)
MCH: 31.3 pg (ref 26.0–34.0)
MCHC: 35 g/dL (ref 30.0–36.0)
MCV: 89.3 fL (ref 78.0–100.0)
Platelets: 196 10*3/uL (ref 150–400)
RBC: 4.96 MIL/uL (ref 4.22–5.81)
RDW: 13.3 % (ref 11.5–15.5)
WBC: 4.6 10*3/uL (ref 4.0–10.5)

## 2016-12-14 LAB — I-STAT CHEM 8, ED
BUN: 16 mg/dL (ref 6–20)
CHLORIDE: 106 mmol/L (ref 101–111)
CREATININE: 1.4 mg/dL — AB (ref 0.61–1.24)
Calcium, Ion: 1.25 mmol/L (ref 1.15–1.40)
GLUCOSE: 131 mg/dL — AB (ref 65–99)
HCT: 49 % (ref 39.0–52.0)
Hemoglobin: 16.7 g/dL (ref 13.0–17.0)
POTASSIUM: 4.7 mmol/L (ref 3.5–5.1)
Sodium: 141 mmol/L (ref 135–145)
TCO2: 26 mmol/L (ref 22–32)

## 2016-12-14 LAB — DIFFERENTIAL
BASOS ABS: 0.1 10*3/uL (ref 0.0–0.1)
Basophils Relative: 1 %
EOS ABS: 0.1 10*3/uL (ref 0.0–0.7)
Eosinophils Relative: 3 %
LYMPHS ABS: 2.2 10*3/uL (ref 0.7–4.0)
Lymphocytes Relative: 48 %
MONO ABS: 0.3 10*3/uL (ref 0.1–1.0)
MONOS PCT: 7 %
Neutro Abs: 1.9 10*3/uL (ref 1.7–7.7)
Neutrophils Relative %: 41 %

## 2016-12-14 LAB — I-STAT TROPONIN, ED: TROPONIN I, POC: 0 ng/mL (ref 0.00–0.08)

## 2016-12-14 LAB — PROTIME-INR
INR: 1.15
Prothrombin Time: 14.6 seconds (ref 11.4–15.2)

## 2016-12-14 LAB — RAPID URINE DRUG SCREEN, HOSP PERFORMED
AMPHETAMINES: NOT DETECTED
Barbiturates: NOT DETECTED
Benzodiazepines: NOT DETECTED
Cocaine: NOT DETECTED
Opiates: NOT DETECTED
TETRAHYDROCANNABINOL: NOT DETECTED

## 2016-12-14 LAB — CBG MONITORING, ED: Glucose-Capillary: 110 mg/dL — ABNORMAL HIGH (ref 65–99)

## 2016-12-14 LAB — APTT: APTT: 32 s (ref 24–36)

## 2016-12-14 MED ORDER — SODIUM CHLORIDE 0.9 % IV SOLN
100.0000 mL/h | INTRAVENOUS | Status: DC
  Administered 2016-12-14: 100 mL/h via INTRAVENOUS

## 2016-12-14 MED ORDER — KETOROLAC TROMETHAMINE 15 MG/ML IJ SOLN
15.0000 mg | Freq: Once | INTRAMUSCULAR | Status: AC
  Administered 2016-12-14: 15 mg via INTRAVENOUS
  Filled 2016-12-14: qty 1

## 2016-12-14 MED ORDER — ACETAMINOPHEN ER 650 MG PO TBCR: 650.0000 mg | EXTENDED_RELEASE_TABLET | Freq: Three times a day (TID) | ORAL | 0 refills | Status: DC | PRN

## 2016-12-14 MED ORDER — LORAZEPAM 2 MG/ML IJ SOLN
1.0000 mg | Freq: Once | INTRAMUSCULAR | Status: AC
  Administered 2016-12-14: 1 mg via INTRAVENOUS
  Filled 2016-12-14: qty 1

## 2016-12-14 MED ORDER — SODIUM CHLORIDE 0.9 % IV BOLUS (SEPSIS)
500.0000 mL | Freq: Once | INTRAVENOUS | Status: AC
  Administered 2016-12-14: 500 mL via INTRAVENOUS

## 2016-12-14 MED ORDER — NAPROXEN 375 MG PO TABS: 375.0000 mg | ORAL_TABLET | Freq: Two times a day (BID) | ORAL | 0 refills | Status: DC

## 2016-12-14 MED ORDER — METOCLOPRAMIDE HCL 5 MG/ML IJ SOLN
10.0000 mg | Freq: Once | INTRAMUSCULAR | Status: AC
  Administered 2016-12-14: 10 mg via INTRAVENOUS
  Filled 2016-12-14: qty 2

## 2016-12-14 NOTE — Consult Note (Signed)
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  Today the patient presents for a recurrent headache with a subjective sense of left-sided numbness and right eye blurry vision which began yesterday evening at around 23:30.  He says this is the fourth episode he had although he has never received evaluation for them.  He has a throbbing unilateral headache on the right with sensitivity to light and sound and some mild upset stomach but no vomiting.  He usually is been taking Tylenol for these and all 4 of these spells have occurred in the past 3 months.  There is nothing about this spell that is different from the prior 3.  It sounds like he made almost a complete recovery from his prior stroke.    Differential Diagnosis:   Complicated migraine most likely diagnosis, seizure or stroke far less likely 1.cardioembolic  2large vessel  3.lacunar  4.hypercoagulable state  5.proximal embolic source     1a- LOC: Keenly responsive - =0      1b- LOC questions: Answers both questions correctly - 0      1c- LOC commands- Performs both tasks correctly- 0      2- Gaze: Normal; no gaze paresis or gaze deviation - 0      3- Visual Fields: normal, no Visual field deficit - 0     increased blurring to the entirety of the right side but no field cut 4- Facial movements: no facial palsy - 0      5- Upper limb motor - no drift -0      6- Lower limb motor - no drift - 0       7- Limb Coordination: absent ataxia - 0       8- Sensory : Right face arm and leg decreased sharp sensation chronic=1 9- Language - No aphasia - 0       10- Speech - No dysarthria -0      11- Neglect / Extinction - none  found -0      NIHSS score 1  Exam is difficult with poor participation and need for repeated encouragement   Comments:    Last Known normal:  23:30 Door time:  08:10 [facility] contacted  08:48 [facility] at bedside: 09:02    NIHSS assessment time:   09:02  Needle time:   NA     Blood glucose and Blood Pressure within acceptable parameters-    CT head Reviewed, and there is no indication of acute hemorrhage or mass effect        Medical Decision Making:     Patient is not a candidate for alteplase due to  last known normal over 4-1/2 hours ago.  Also with this being the patient's fourth episode very low suspicion for ischemic stroke and he does have classic features of migrainous phenomenon with a moderate headache during which she is unable to perform normal activities that is unilateral and throbbing in nature with sensitivity to light and sound.  This is however a fairly new diagnosis.  This is his fourth episode.  Recommend MRI of the brain without contrast as he never had a workup for these headaches in the past and  if this were unremarkable can treat his headache with non-vasoactive medications to see if neurologic symptoms resolved with resolution of the headache pain.  He may continue his home aspirin regimen.  If MRI of the brain does not show a stroke in the patient's symptoms resolved he may not need any additional workup should follow up with neurology as an outpatient.  If his symptoms do not improve or his headache remains refractory or MRI shows focal lesion of course additional neurologic diagnostic testing can be pursued. Presentation is not suggestive of Large Vessel Occlusive Disease             Recommendations:     1.  Daily anti-thrombotic therapy  2.  Consult inpatient neurology -if MRI is abnormal or headache or focal symptoms do not improve with treatment.  Can Of Course Observe If Needed             Discussed with ED physician      Please call with  questions.  Jossie NgKatherine Darcie Mellone  [facility]     Medical Decision Making:  - Extensive number of diagnosis or management options are considered above.  - Extensive amount of complex data reviewed.  - High risk of complication and/or morbidity or mortality are associated with differential diagnostic considerations above.  - There may be Uncertain outcome and increased probability of prolonged functional impairment or high probability of severe prolonged functional impairment associated with some of these differential diagnosis.     Medical Data Reviewed:  1.Data reviewed include clinical labs, radiology, Medical Tests;  2.Tests results discussed w/performing or interpreting physician;  3.Obtaining/reviewing old medical records;  4.Obtaining case history from another source;        5.Independent review of image, tracing or specimen.

## 2016-12-14 NOTE — ED Notes (Addendum)
Patient transported to CT. CHARGE PATTI RN AND LOGAN RN PRESENT WITH PT

## 2016-12-14 NOTE — Discharge Instructions (Signed)
°  All the results in the ER are normal, labs and imaging. We suspect that you have complex headaches. Take the medicine prescribed round the clock and see the Neurologist as requested. The workup in the ER is not complete, and is limited to screening for life threatening and emergent conditions only, so please see a primary care doctor for further evaluation.

## 2016-12-14 NOTE — ED Triage Notes (Signed)
Pt reports he began to have blurred vision in his R eye and R side tingling last night at 11pm. Symptoms worsening to additional R side extremity weakness, R facial tingling and HA in addition. Pt also having difficulty speaking.

## 2016-12-14 NOTE — ED Notes (Signed)
ED Provider at bedside. 

## 2016-12-14 NOTE — ED Notes (Signed)
Pt. Ambulated to bathroom. Pt. Is a/o X4 and walks great.

## 2016-12-14 NOTE — ED Provider Notes (Signed)
[facility]-EMERGENCY DEPT Provider Note   CSN: [id number] Arrival date & time: 12/14/16  2841     History   Chief Complaint Chief Complaint  Patient presents with  . Extremity Weakness  . Blurred Vision    HPI Cesar Munoz is a 51 y.o. male.  HPI  Patient with history of stroke, hypertension, MI comes in with chief complaint of right-sided weakness. Patient reports that he was last normal around 11 PM last night. Soon after patient started having severe headache, throbbing in nature along with some visual deficits on the right side.  This morning patient complained about the same to his wife and he was brought into the emergency room.  Patient also reports tingling sensation over the right side, and weakness in his right leg.  Patient's previous stroke led to right-sided deficits, but per wife patient had mostly recovered. Patient denies any dizziness, neck pain, fevers, trauma.  Past Medical History:  Diagnosis Date  . Hypertension   . MI (myocardial infarction) (HCC)   . Stroke [facility])     There are no active problems to display for this patient.   Past Surgical History:  Procedure Laterality Date  . HAND SURGERY Right      Home Medications    Prior to Admission medications   Medication Sig Start Date End Date Taking? Authorizing Provider  amLODipine (NORVASC) 5 MG tablet TAKE 5 MG BY MOUTH DAILY 02/02/16  Yes [provider]  losartan (COZAAR) 100 MG tablet TAKE 100 MG BY MOUTH DAILY 02/02/16  Yes [provider]  nitroGLYCERIN (NITROSTAT) 0.4 MG SL tablet Place 0.4 mg under the tongue every 5 (five) minutes as needed for chest pain.   Yes [provider]  tiZANidine (ZANAFLEX) 4 MG tablet Take 4 mg by mouth every 8 (eight) hours as needed for muscle spasms.    Yes [provider]  methocarbamol (ROBAXIN) 500 MG tablet Take 1 tablet (500 mg total) by mouth 2 (two) times daily. Patient not taking: Reported on  12/14/2016 02/22/16   Emi Holes, PA-C  ondansetron (ZOFRAN) 4 MG tablet Take 1 tablet (4 mg total) by mouth every 6 (six) hours. Patient not taking: Reported on 12/14/2016 02/22/16   Emi Holes, PA-C  oxyCODONE-acetaminophen (PERCOCET/ROXICET) 5-325 MG tablet Take 1-2 tablets by mouth every 6 (six) hours as needed for severe pain. Patient not taking: Reported on 12/14/2016 02/22/16   Emi Holes, PA-C    Family History History reviewed. No pertinent family history.  Social History Social History   Tobacco Use  . Smoking status: Never Smoker  . Smokeless tobacco: Never Used  Substance Use Topics  . Alcohol use: No  . Drug use: Not on file     Allergies   Patient has no known allergies.   Review of Systems Review of Systems  Unable to perform ROS: Acuity of condition  Constitutional: Positive for activity change.  Respiratory: Negative for shortness of breath.   Cardiovascular: Negative for chest pain.  Neurological: Positive for weakness, numbness and headaches.     Physical Exam Updated Vital Signs BP (!) 149/80   Pulse 68   Temp 97.9 F (36.6 C) (Oral)   Resp 18   Ht 5\' 10"  (1.778 m)   Wt 104.3 kg (230 lb)   SpO2 97%   BMI 33.00 kg/m   Physical Exam  Constitutional: He is oriented to person, place, and time. He appears well-developed.  HENT:  Head: Atraumatic.  Eyes:  EOM are normal. Pupils are equal, round, and reactive to light.  Neck: Neck supple.  Cardiovascular: Normal rate and intact distal pulses.  Pulmonary/Chest: Effort normal. No respiratory distress.  Abdominal: Soft.  Neurological: He is oriented to person, place, and time.  Somnolent, R sided mild facial droop, RUE and RLE weakness, with subjective R sided numbness. NIHSS  Skin: Skin is warm.  Nursing note and vitals reviewed.    ED Treatments / Results  Labs (all labs ordered are listed, but only abnormal results are displayed) Labs Reviewed  COMPREHENSIVE METABOLIC  PANEL - Abnormal; Notable for the following components:      Result Value   Glucose, Bld 131 (*)    Creatinine, Ser 1.42 (*)    GFR calc non Af Amer 55 (*)    All other components within normal limits  URINALYSIS, ROUTINE W REFLEX MICROSCOPIC - Abnormal; Notable for the following components:   Color, Urine STRAW (*)    All other components within normal limits  CBG MONITORING, ED - Abnormal; Notable for the following components:   Glucose-Capillary 110 (*)    All other components within normal limits  I-STAT CHEM 8, ED - Abnormal; Notable for the following components:   Creatinine, Ser 1.40 (*)    Glucose, Bld 131 (*)    All other components within normal limits  PROTIME-INR  APTT  CBC  DIFFERENTIAL  RAPID URINE DRUG SCREEN, HOSP PERFORMED  I-STAT TROPONIN, ED    EKG  EKG Interpretation  Date/Time:  Thursday December 14 2016 08:25:21 EST Ventricular Rate:  67 PR Interval:    QRS Duration: 91 QT Interval:  382 QTC Calculation: 404 R Axis:   69 Text Interpretation:  Sinus rhythm Anteroseptal infarct, old No acute changes No significant change since last tracing Confirmed by Derwood Kaplan [phone]) on 12/14/2016 8:48:56 AM       Radiology Mr Brain Wo Contrast  Result Date: 12/14/2016 CLINICAL DATA:  Stroke EXAM: MRI HEAD WITHOUT CONTRAST TECHNIQUE: Multiplanar, multiecho pulse sequences of the brain and surrounding structures were obtained without intravenous contrast. COMPARISON:  CT 12/14/2016 FINDINGS: Brain: Incomplete study. Patient not able to hold still . Sagittal T1 axial diffusion only was obtained with motion degraded imaging. Negative for acute infarct.  Ventricle size normal. IMPRESSION: Negative for acute infarct.  Incomplete study Electronically Signed   By: Marlan Palau M.D.   On: 12/14/2016 11:46   Ct Head Code Stroke Wo Contrast  Result Date: 12/14/2016 CLINICAL DATA:  Code stroke. Focal neuro deficit. Stroke suspected. Headache EXAM: CT HEAD WITHOUT  CONTRAST TECHNIQUE: Contiguous axial images were obtained from the base of the skull through the vertex without intravenous contrast. COMPARISON:  CT head 12/14/2016 FINDINGS: Brain: No evidence of acute infarction, hemorrhage, hydrocephalus, extra-axial collection or mass lesion/mass effect. Vascular: Negative for hyperdense vessel Skull: Negative Sinuses/Orbits: Negative Other: None ASPECTS (Alberta Stroke Program Early CT Score) - Ganglionic level infarction (caudate, lentiform nuclei, internal capsule, insula, M1-M3 cortex): 7 - Supraganglionic infarction (M4-M6 cortex): 3 Total score (0-10 with 10 being normal): 10 IMPRESSION: 1. Negative CT Head 2. ASPECTS is 10 These results were called by telephone at the time of interpretation on 12/14/2016 at 8:48 am to Dr. Derwood Kaplan , who verbally acknowledged these results. Electronically Signed   By: Marlan Palau M.D.   On: 12/14/2016 08:48    Procedures Procedures (including critical care time)  Medications Ordered in ED Medications  sodium chloride 0.9 % bolus 500 mL (500 mLs Intravenous New  Bag/Given 12/14/16 [id number])    Followed by  0.9 %  sodium chloride infusion (100 mL/hr Intravenous New Bag/Given 12/14/16 1004)  metoCLOPramide (REGLAN) injection 10 mg (10 mg Intravenous Given 12/14/16 0925)  ketorolac (TORADOL) 15 MG/ML injection 15 mg (15 mg Intravenous Given 12/14/16 0929)  LORazepam (ATIVAN) injection 1 mg (1 mg Intravenous Given 12/14/16 1047)     Initial Impression / Assessment and Plan / ED Course  I have reviewed the triage vital signs and the nursing notes.  Pertinent labs & imaging results that were available during my care of the patient were reviewed by me and considered in my medical decision making (see chart for details).  Clinical Course as of Dec 15 1198  Thu Dec 14, 2016  [id number] Tele-stroke called.  [AN]  [id number] Telemetry neurology consultation was completed.  The neurologist thinks that patient is having complex  migraines.  Given that there is no formal diagnosis of migraine, she recommends that patient get MRI brain without contrast.  If there is no acute stroke patient can be discharged with optimal follow-up instructions.  Results from the ER workup discussed with the patient face to face and all questions answered to the best of my ability.   [AN]  1023 Patient is feeling better.  Strength is better, alertness is better, headache has improved significantly.  [AN]  1158 MRI was limited due to patient's claustrophobia.  Prior to the MRI, I had reassessed the patient and he had reported that his symptoms were much improved.  He was still having slight right-sided tingling.  The part of MRI that is completed does not show any acute infarct. We will discharge patient with outpatient neuro follow-up.  [AN]    Clinical Course User Index [AN] Derwood KaplanNanavati, Liban Guedes, MD    Patient comes in with chief complaint of headache, right-sided vision loss, right-sided numbness and weakness. Patient has history of stroke, MI, hypertension.  Prior stroke led to right-sided symptoms, but patient had no significant residual deficits. Differential diagnosis includes stroke, head bleed, complex migraine, hypertensive emergency. Code stroke activated given the visual deficits.  Last normal 10 hours ago.  12:00 PM Results from the ER workup discussed with the patient face to face and all questions answered to the best of my ability. Strict ER return precautions have been discussed, and patient is agreeing with the plan and is comfortable with the workup done and the recommendations from the ER.  Final Clinical Impressions(s) / ED Diagnoses   Final diagnoses:  Blurred vision  Paresthesia    ED Discharge Orders    None       Derwood KaplanNanavati, Teran Knittle, MD 12/14/16 1200

## 2016-12-18 ENCOUNTER — Other Ambulatory Visit: Payer: Self-pay | Admitting: Family Medicine

## 2016-12-18 DIAGNOSIS — G459 Transient cerebral ischemic attack, unspecified: Secondary | ICD-10-CM | POA: Diagnosis not present

## 2016-12-18 DIAGNOSIS — I10 Essential (primary) hypertension: Secondary | ICD-10-CM | POA: Diagnosis not present

## 2016-12-18 DIAGNOSIS — G43909 Migraine, unspecified, not intractable, without status migrainosus: Secondary | ICD-10-CM | POA: Diagnosis not present

## 2016-12-19 ENCOUNTER — Encounter: Payer: Self-pay | Admitting: Neurology

## 2016-12-22 ENCOUNTER — Ambulatory Visit
Admission: RE | Admit: 2016-12-22 | Discharge: 2016-12-22 | Disposition: A | Discharge status: Home / Self Care | Payer: BLUE CROSS/BLUE SHIELD | Source: Ambulatory Visit | Attending: Family Medicine | Admitting: Family Medicine

## 2016-12-22 DIAGNOSIS — G459 Transient cerebral ischemic attack, unspecified: Secondary | ICD-10-CM | POA: Diagnosis not present

## 2016-12-22 DIAGNOSIS — N189 Chronic kidney disease, unspecified: Secondary | ICD-10-CM | POA: Diagnosis not present

## 2016-12-22 DIAGNOSIS — I129 Hypertensive chronic kidney disease with stage 1 through stage 4 chronic kidney disease, or unspecified chronic kidney disease: Secondary | ICD-10-CM | POA: Diagnosis not present

## 2016-12-22 DIAGNOSIS — I25118 Atherosclerotic heart disease of native coronary artery with other forms of angina pectoris: Secondary | ICD-10-CM | POA: Diagnosis not present

## 2016-12-22 DIAGNOSIS — I6523 Occlusion and stenosis of bilateral carotid arteries: Secondary | ICD-10-CM | POA: Diagnosis not present

## 2016-12-27 DIAGNOSIS — M791 Myalgia, unspecified site: Secondary | ICD-10-CM | POA: Diagnosis not present

## 2016-12-29 ENCOUNTER — Encounter: Payer: Self-pay | Admitting: Neurology

## 2016-12-29 ENCOUNTER — Ambulatory Visit (INDEPENDENT_AMBULATORY_CARE_PROVIDER_SITE_OTHER): Payer: PRIVATE HEALTH INSURANCE | Admitting: Neurology

## 2016-12-29 VITALS — BP 137/83 | HR 63 | Wt 242.4 lb

## 2016-12-29 DIAGNOSIS — G43109 Migraine with aura, not intractable, without status migrainosus: Secondary | ICD-10-CM

## 2016-12-29 DIAGNOSIS — I10 Essential (primary) hypertension: Secondary | ICD-10-CM | POA: Diagnosis not present

## 2016-12-29 DIAGNOSIS — G43409 Hemiplegic migraine, not intractable, without status migrainosus: Secondary | ICD-10-CM | POA: Insufficient documentation

## 2016-12-29 MED ORDER — DICLOFENAC POTASSIUM 50 MG PO TABS: ORAL_TABLET | ORAL | 6 refills | Status: DC

## 2016-12-29 NOTE — Patient Instructions (Signed)
Diclofenac What is this medicine? DICLOFENAC (dye KLOE fen ak) is a non-steroidal anti-inflammatory drug (NSAID). It is used to reduce swelling and to treat pain. It may be used to treat osteoarthritis, rheumatoid arthritis, ankylosing spondylitis, and migraines This medicine may be used for other purposes; ask your health care provider or pharmacist if you have questions. COMMON BRAND NAME(S): Voltaren What should I tell my health care provider before I take this medicine? They need to know if you have any of these conditions: -asthma, especially aspirin sensitive asthma -coronary artery bypass graft (CABG) surgery within the past 2 weeks -drink more than 3 alcohol containing drinks a day -heart disease or circulation problems like heart failure or leg edema (fluid retention) -high blood pressure -kidney disease -liver disease -stomach bleeding or ulcers -an unusual or allergic reaction to diclofenac, aspirin, other NSAIDs, other medicines, foods, dyes, or preservatives -pregnant or trying to get pregnant -breast-feeding How should I use this medicine? Take this medicine by mouth with food and with a full glass of water. Do not crush or chew the medicine. Follow the directions on the prescription label. Take your medicine at regular intervals. Do not take your medicine more often than directed. Long-term, continuous use may increase the risk of heart attack or stroke. A special MedGuide will be given to you by the pharmacist with each prescription and refill. Be sure to read this information carefully each time. Talk to your pediatrician regarding the use of this medicine in children. Special care may be needed. Elderly patients over 52 years old may have a stronger reaction and need a smaller dose. Overdosage: If you think you have taken too much of this medicine contact a poison control center or emergency room at once. NOTE: This medicine is only for you. Do not share this medicine with  others. What if I miss a dose? If you miss a dose, take it as soon as you can. If it is almost time for your next dose, take only that dose. Do not take double or extra doses. What may interact with this medicine? Do not take this medicine with any of the following medications: -cidofovir -ketorolac -methotrexate This medicine may also interact with the following medications: -alcohol -aspirin and aspirin like medicines -cyclosporine -diuretics -lithium -medicines for blood pressure -medicines for osteoporosis -medicines that affect platelets -medicines that treat or prevent blood clots like warfarin -NSAIDs, medicines for pain and inflammation, like ibuprofen or naproxen -pemetrexed -steroid medicines like prednisone or cortisone This list may not describe all possible interactions. Give your health care provider a list of all the medicines, herbs, non-prescription drugs, or dietary supplements you use. Also tell them if you smoke, drink alcohol, or use illegal drugs. Some items may interact with your medicine. What should I watch for while using this medicine? Tell your doctor or health care professional if your pain does not get better. Talk to your doctor before taking another medicine for pain. Do not treat yourself. This medicine does not prevent heart attack or stroke. In fact, this medicine may increase the chance of a heart attack or stroke. The chance may increase with longer use of this medicine and in people who have heart disease. If you take aspirin to prevent heart attack or stroke, talk with your doctor or health care professional. Do not take medicines such as ibuprofen and naproxen with this medicine. Side effects such as stomach upset, nausea, or ulcers may be more likely to occur. Many medicines available without a  prescription should not be taken with this medicine. This medicine can cause ulcers and bleeding in the stomach and intestines at any time during treatment.  Do not smoke cigarettes or drink alcohol. These increase irritation to your stomach and can make it more susceptible to damage from this medicine. Ulcers and bleeding can happen without warning symptoms and can cause death. You may get drowsy or dizzy. Do not drive, use machinery, or do anything that needs mental alertness until you know how this medicine affects you. Do not stand or sit up quickly, especially if you are an older patient. This reduces the risk of dizzy or fainting spells. This medicine can cause you to bleed more easily. Try to avoid damage to your teeth and gums when you brush or floss your teeth. What side effects may I notice from receiving this medicine? Side effects that you should report to your doctor or health care professional as soon as possible: -allergic reactions like skin rash, itching or hives, swelling of the face, lips, or tongue -black or bloody stools, blood in the urine or vomit -blurred vision -chest pain -difficulty breathing or wheezing -nausea or vomiting -slurred speech or weakness on one side of the body -unexplained weight gain or swelling -unusually weak or tired -yellowing of eyes or skin Side effects that usually do not require medical attention (report to your doctor or health care professional if they continue or are bothersome): -constipation -diarrhea -dizziness -headache -heartburn This list may not describe all possible side effects. Call your doctor for medical advice about side effects. You may report side effects to FDA at 1-800-FDA-1088. Where should I keep my medicine? Keep out of the reach of children. Store at room temperature below 30 degrees C (86 degrees F). Protect from moisture. Keep container tightly closed. Throw away any unused medicine after the expiration date. NOTE: This sheet is a summary. It may not cover all possible information. If you have questions about this medicine, talk to your doctor, pharmacist, or health care  provider.  2018 [employer]/Gold Standard (2008-05-29 14:42:31)

## 2016-12-29 NOTE — Progress Notes (Addendum)
[id number]    Provider:  Dr Lucia Gaskins Referring Provider: Lewis Moccasin, MD Primary Care Physician:  Lewis Moccasin, MD  CC:  Migraine  HPI:  Cesar Munoz is a 51 y.o. male here as a referral from Dr. Duanne Guess for headache.  Past medical history of migraines (5 years ago with migraines), hypertension, stroke, myocardial infarction. Residual right-sided numbness from previous stroke.He presented to ED 12/14/2016 with headache, left-sided numbness and weakness with facial droop and right eye blurry vision. Has happened multiple times in the past. Migrainous features include pulsating, light and sound sensitivity, unilateral headache, nausea, no vomiting.NIHSS 1.  Best corrected 20/20. Doesn't think he snores, doesn't really wake up with headaches. No excessive fatigue during the day or nodding off. He started having headaches in July, he has not had a headache since November. There was a lot of stress. He has had multiple episodes. Starts on the right, pulsating, pounding, throbbing, light sensitivity, sound sensitivity, sound bothered him, decreased appetite, it was so severe he couldn't drive, the right eye had vision loss, He had right-sided weakness and facial droop. Once the headache went away, the symptoms resolved. They can last days.  No other focal neurologic deficits, associated symptoms, inciting events or modifiable factors.  Reviewed notes, labs and imaging from outside physicians, which showed:  Reviewed emergency room notes.  Patient was seen December 14, 2016 for blurred vision.  Patient developed right-sided headache and weakness the night before.  The headache was throbbing in nature along with some visual defects on the right side.  The next morning patient complained about the same to his wife and he was brought to the emergency room.  Also reported tingling sensation over the right side and weakness in his right leg.  Previous stroke without ongoing deficits.   There was no dizziness, neck pain, fevers or trauma.  Exam was significant for somnolence, right-sided mild facial droop, right upper extremity and right lower extremity weakness with subjective right-sided numbness.  Glucose was 131, creatinine 1.42, all other labs were unremarkable.  Telemetry neuro consultation was completed.  Was diagnosed with complex migraines.  MRI of the brain was recommended.  MRI was limited due to patient's claustrophobia, patient was discharged due to improvement.  Limited MRI was negative for acute infarct.  Personally reviewed limited MRI images, no acute infarct on DWI.  Chem8 with elevated creatinine 1.4.  Review of Systems: Patient complains of symptoms per HPI as well as the following symptoms: headache. Pertinent negatives and positives per HPI. All others negative.   Social History   Socioeconomic History  . Marital status: Married    Spouse name: Not on file  . Number of children: Not on file  . Years of education: Not on file  . Highest education level: Not on file  Social Needs  . Financial resource strain: Not on file  . Food insecurity - worry: Not on file  . Food insecurity - inability: Not on file  . Transportation needs - medical: Not on file  . Transportation needs - non-medical: Not on file  Occupational History  . Not on file  Tobacco Use  . Smoking status: Never Smoker  . Smokeless tobacco: Never Used  Substance and Sexual Activity  . Alcohol use: No  . Drug use: No  . Sexual activity: Not on file  Other Topics Concern  . Not on file  Social History Narrative  . Not on file    Family History  Problem Relation  Age of Onset  . Migraines Neg Hx     Past Medical History:  Diagnosis Date  . Hypertension   . MI (myocardial infarction) (HCC)   . Migraine   . Stroke [facility](HCC)     Past Surgical History:  Procedure Laterality Date  . HAND SURGERY Right     Current Outpatient Medications  Medication Sig Dispense Refill  .  acetaminophen (TYLENOL 8 HOUR) 650 MG CR tablet Take 1 tablet (650 mg total) by mouth every 8 (eight) hours as needed for pain. 30 tablet 0  . amLODipine (NORVASC) 5 MG tablet TAKE 5 MG BY MOUTH DAILY  5  . fluticasone (FLONASE) 50 MCG/ACT nasal spray Place into both nostrils daily.    Marland Kitchen. olmesartan (BENICAR) 40 MG tablet Take 40 mg by mouth daily.  0  . diclofenac (CATAFLAM) 50 MG tablet Take immediately at onset of headache. May take up to three times a day. 30 tablet 6   No current facility-administered medications for this visit.     Allergies as of 12/29/2016  . (No Known Allergies)    Vitals: BP 137/83 (BP Location: Right Arm, Patient Position: Sitting, Cuff Size: Normal)   Pulse 63   Wt 242 lb 6.4 oz (110 kg)   BMI 34.78 kg/m  Last Weight:  Wt Readings from Last 1 Encounters:  12/29/16 242 lb 6.4 oz (110 kg)   Last Height:   Ht Readings from Last 1 Encounters:  12/14/16 5\' 10"  (1.778 m)   Physical exam: Exam: Gen: NAD, conversant, well nourised, obese, well groomed                     CV: RRR, no MRG. No Carotid Bruits. No peripheral edema, warm, nontender Eyes: Conjunctivae clear without exudates or hemorrhage  Neuro: Detailed Neurologic Exam  Speech:    Speech is normal; fluent and spontaneous with normal comprehension.  Cognition:    The patient is oriented to person, place, and time;     recent and remote memory intact;     language fluent;     normal attention, concentration,     fund of knowledge Cranial Nerves:    The pupils are equal, round, and reactive to light. Attempted fundoscopic exam could not visualize. Visual fields are full to finger confrontation. Extraocular movements are intact. Trigeminal sensation is intact and the muscles of mastication are normal. The face is symmetric. The palate elevates in the midline. Hearing intact. Voice is normal. Shoulder shrug is normal. The tongue has normal motion without fasciculations.   Coordination:     Normal finger to nose  Gait:    Not ataxic  Motor Observation:    No asymmetry, no atrophy, and no involuntary movements noted. Tone:    Normal muscle tone.    Posture:    Posture is normal. normal erect    Strength:    Strength is V/V in the upper and lower limbs.      Sensation: intact to LT     Reflex Exam:  DTR's:    Deep tendon reflexes in the upper and lower extremities are symmetrical bilaterally.   Toes:    The toes are equivocal    Clonus:    Clonus is absent.       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 Lie down and place a cool compress on your head.  Avoid headache triggers. If certain foods or odors seem to have triggered your migraines in the past, avoid them. A headache diary might help you identify triggers.  Include physical activity in your daily routine. Try a daily walk or other moderate aerobic exercise.  Manage stress. Find healthy ways to cope with the stressors, such as delegating tasks on your to-do list.  Practice relaxation techniques. Try deep breathing, yoga, massage and visualization.  Eat regularly. Eating regularly scheduled meals and maintaining a healthy diet might help prevent headaches. Also, drink plenty of fluids.  Follow a regular sleep schedule. Sleep deprivation might contribute to headaches Consider biofeedback. With this mind-body technique, you learn to control certain bodily functions - such as muscle tension, heart rate and blood pressure - to prevent headaches or reduce headache pain.    Proceed to  emergency room if you experience new or worsening symptoms or symptoms do not resolve, if you have new neurologic symptoms or if headache is severe, or for any concerning symptom.   Provided education and documentation from American headache Society toolbox including articles on: chronic migraine medication overuse headache, chronic migraines, prevention of migraines, behavioral and other nonpharmacologic treatments for headache.  Discussed: There is increased risk for stroke in  migraine with aura. The risk for people with migraine without aura is lower and other risk factors like smoking are far more likely to increase stroke risk than migraine. There is a recommendation for no smoking and managing vascular risk factors very closely.   Cc: Dr. Maylene Roes, MD  [facility] [address] [place], [place] [address]  Phone [phone] Fax [phone]

## 2017-01-05 DIAGNOSIS — I10 Essential (primary) hypertension: Secondary | ICD-10-CM | POA: Diagnosis not present

## 2017-01-05 DIAGNOSIS — Z6835 Body mass index (BMI) 35.0-35.9, adult: Secondary | ICD-10-CM | POA: Diagnosis not present

## 2017-01-25 ENCOUNTER — Other Ambulatory Visit: Payer: Self-pay | Admitting: Neurology

## 2017-01-25 ENCOUNTER — Telehealth: Payer: Self-pay | Admitting: Neurology

## 2017-01-25 MED ORDER — BUTALBITAL-APAP-CAFFEINE 50-325-40 MG PO TABS: 1.0000 | ORAL_TABLET | Freq: Four times a day (QID) | ORAL | 3 refills | Status: DC | PRN

## 2017-01-25 NOTE — Telephone Encounter (Signed)
Tried to call patient back again, LVM asking for call back.

## 2017-01-25 NOTE — Telephone Encounter (Signed)
Pt returning call

## 2017-01-25 NOTE — Telephone Encounter (Signed)
Pt calling re: the medication he was given for his migraines, he states that it is not strong enough and would like to have something else called in because his migraines continue to come back. Please call

## 2017-01-25 NOTE — Telephone Encounter (Signed)
Tried to call patient back but the other end was silent after a few rings, no answer or voicemail.

## 2017-01-25 NOTE — Telephone Encounter (Signed)
Patient call back. He states that migraines have been coming and going for the last couple weeks, stronger in the last week. They come in the morning and more so in the evening. He currently has light tension. I told him I would speak with Dr. Lucia Gaskins and call him back if any medication changes are ordered.

## 2017-01-25 NOTE — Telephone Encounter (Signed)
Can try Fioricet at onset, if he has never tried that will fax in a prescription for him. thanks

## 2017-01-26 NOTE — Telephone Encounter (Signed)
Called [facility] on [address] of [address] and canceled the Fioricet prescription. Faxed to pt's new preferred pharmacy [facility] on [address]

## 2017-01-26 NOTE — Telephone Encounter (Signed)
Faxed signed Fioricet prescription to pharmacy. Received a receipt of confirmation.

## 2017-01-27 DIAGNOSIS — M791 Myalgia, unspecified site: Secondary | ICD-10-CM | POA: Diagnosis not present

## 2017-02-19 ENCOUNTER — Telehealth: Payer: Self-pay | Admitting: Nurse Practitioner
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
## 2017-02-19 NOTE — Telephone Encounter (Signed)
In the meantime he need to see his primary care first, for the arm issue and the black out - needs to evaluated by pcp for reasons such as cardiac which can cause both issues. If his primary care evaluates him and does not find any causes then he can call ack and we will see him sooner.

## 2017-02-19 NOTE — Telephone Encounter (Signed)
Pt wants a call from nurse regarding getting a sooner apt than June (next avail was April) for symptoms headaches, right arm tingling and numbness. [phone]

## 2017-02-20 NOTE — Telephone Encounter (Signed)
Called patient and discussed that per Dr. Lucia Gaskins, pt advised to see PCP to r/o cardiac issues that can cause both issues (arm & blackout). He verbalized understanding and said he has had a carotid ultrasound done. He will call his PCP and will call us back.

## 2017-02-23 ENCOUNTER — Other Ambulatory Visit: Payer: Self-pay | Admitting: Cardiology

## 2017-02-23 DIAGNOSIS — N189 Chronic kidney disease, unspecified: Secondary | ICD-10-CM | POA: Diagnosis not present

## 2017-02-23 DIAGNOSIS — I25110 Atherosclerotic heart disease of native coronary artery with unstable angina pectoris: Secondary | ICD-10-CM | POA: Diagnosis not present

## 2017-02-23 DIAGNOSIS — E785 Hyperlipidemia, unspecified: Secondary | ICD-10-CM | POA: Diagnosis not present

## 2017-02-23 DIAGNOSIS — R079 Chest pain, unspecified: Secondary | ICD-10-CM

## 2017-02-23 DIAGNOSIS — I129 Hypertensive chronic kidney disease with stage 1 through stage 4 chronic kidney disease, or unspecified chronic kidney disease: Secondary | ICD-10-CM | POA: Diagnosis not present

## 2017-02-27 DIAGNOSIS — M791 Myalgia, unspecified site: Secondary | ICD-10-CM | POA: Diagnosis not present

## 2017-03-05 ENCOUNTER — Encounter (HOSPITAL_COMMUNITY)
Admission: RE | Admit: 2017-03-05 | Discharge: 2017-03-05 | Disposition: A | Discharge status: Home / Self Care | Payer: BLUE CROSS/BLUE SHIELD | Source: Ambulatory Visit | Attending: Cardiology | Admitting: Cardiology

## 2017-03-05 DIAGNOSIS — R079 Chest pain, unspecified: Secondary | ICD-10-CM | POA: Insufficient documentation

## 2017-03-05 DIAGNOSIS — E785 Hyperlipidemia, unspecified: Secondary | ICD-10-CM | POA: Diagnosis not present

## 2017-03-05 DIAGNOSIS — R9431 Abnormal electrocardiogram [ECG] [EKG]: Secondary | ICD-10-CM | POA: Diagnosis not present

## 2017-03-05 DIAGNOSIS — I25110 Atherosclerotic heart disease of native coronary artery with unstable angina pectoris: Secondary | ICD-10-CM | POA: Diagnosis not present

## 2017-03-05 DIAGNOSIS — I10 Essential (primary) hypertension: Secondary | ICD-10-CM | POA: Diagnosis not present

## 2017-03-05 LAB — BASIC METABOLIC PANEL
Anion gap: 9 (ref 5–15)
BUN: 10 mg/dL (ref 6–20)
CHLORIDE: 107 mmol/L (ref 101–111)
CO2: 25 mmol/L (ref 22–32)
CREATININE: 1.44 mg/dL — AB (ref 0.61–1.24)
Calcium: 8.8 mg/dL — ABNORMAL LOW (ref 8.9–10.3)
GFR calc Af Amer: >60 mL/min (ref >60)
GFR calc non Af Amer: 54 mL/min — ABNORMAL LOW (ref >60)
GLUCOSE: 113 mg/dL — AB (ref 65–99)
POTASSIUM: 4.2 mmol/L (ref 3.5–5.1)
Sodium: 141 mmol/L (ref 135–145)

## 2017-03-05 LAB — LIPID PANEL
CHOL/HDL RATIO: 4.5 ratio
Cholesterol: 192 mg/dL (ref 0–200)
HDL: 43 mg/dL (ref >40)
LDL Cholesterol: 134 mg/dL — ABNORMAL HIGH (ref 0–99)
Triglycerides: 76 mg/dL (ref <150)
VLDL: 15 mg/dL (ref 0–40)

## 2017-03-05 LAB — HEPATIC FUNCTION PANEL
ALT: 25 U/L (ref 17–63)
AST: 16 U/L (ref 15–41)
Albumin: 3.6 g/dL (ref 3.5–5.0)
Alkaline Phosphatase: 77 U/L (ref 38–126)
BILIRUBIN TOTAL: 0.7 mg/dL (ref 0.3–1.2)
Total Protein: 6.5 g/dL (ref 6.5–8.1)

## 2017-03-05 MED ORDER — TECHNETIUM TC 99M TETROFOSMIN IV KIT
10.0000 | PACK | Freq: Once | INTRAVENOUS | Status: AC | PRN
  Administered 2017-03-05: 10 via INTRAVENOUS

## 2017-03-05 MED ORDER — TECHNETIUM TC 99M TETROFOSMIN IV KIT
30.0000 | PACK | Freq: Once | INTRAVENOUS | Status: AC | PRN
  Administered 2017-03-05: 30 via INTRAVENOUS

## 2017-03-05 MED ORDER — REGADENOSON 0.4 MG/5ML IV SOLN
INTRAVENOUS | Status: AC
  Administered 2017-03-05: 0.4 mg via INTRAVENOUS
  Filled 2017-03-05: qty 5

## 2017-03-05 MED ORDER — REGADENOSON 0.4 MG/5ML IV SOLN
0.4000 mg | Freq: Once | INTRAVENOUS | Status: AC
  Administered 2017-03-05: 0.4 mg via INTRAVENOUS

## 2017-03-14 DIAGNOSIS — N189 Chronic kidney disease, unspecified: Secondary | ICD-10-CM | POA: Diagnosis not present

## 2017-03-14 DIAGNOSIS — I1310 Hypertensive heart and chronic kidney disease without heart failure, with stage 1 through stage 4 chronic kidney disease, or unspecified chronic kidney disease: Secondary | ICD-10-CM | POA: Diagnosis not present

## 2017-03-14 DIAGNOSIS — I25118 Atherosclerotic heart disease of native coronary artery with other forms of angina pectoris: Secondary | ICD-10-CM | POA: Diagnosis not present

## 2017-03-14 DIAGNOSIS — I5020 Unspecified systolic (congestive) heart failure: Secondary | ICD-10-CM | POA: Diagnosis not present

## 2017-03-20 DIAGNOSIS — Z6835 Body mass index (BMI) 35.0-35.9, adult: Secondary | ICD-10-CM | POA: Diagnosis not present

## 2017-03-20 DIAGNOSIS — I10 Essential (primary) hypertension: Secondary | ICD-10-CM | POA: Diagnosis not present

## 2017-03-20 DIAGNOSIS — N289 Disorder of kidney and ureter, unspecified: Secondary | ICD-10-CM | POA: Diagnosis not present

## 2017-03-20 DIAGNOSIS — R7303 Prediabetes: Secondary | ICD-10-CM | POA: Diagnosis not present

## 2017-03-27 DIAGNOSIS — M791 Myalgia, unspecified site: Secondary | ICD-10-CM | POA: Diagnosis not present
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
## 2017-04-03 ENCOUNTER — Ambulatory Visit: Payer: BLUE CROSS/BLUE SHIELD | Admitting: Neurology

## 2017-04-20 DIAGNOSIS — N189 Chronic kidney disease, unspecified: Secondary | ICD-10-CM | POA: Diagnosis not present

## 2017-04-20 DIAGNOSIS — I129 Hypertensive chronic kidney disease with stage 1 through stage 4 chronic kidney disease, or unspecified chronic kidney disease: Secondary | ICD-10-CM | POA: Diagnosis not present

## 2017-04-20 DIAGNOSIS — R002 Palpitations: Secondary | ICD-10-CM | POA: Diagnosis not present

## 2017-04-20 DIAGNOSIS — I25118 Atherosclerotic heart disease of native coronary artery with other forms of angina pectoris: Secondary | ICD-10-CM | POA: Diagnosis not present

## 2017-04-27 DIAGNOSIS — M791 Myalgia, unspecified site: Secondary | ICD-10-CM | POA: Diagnosis not present

## 2017-05-10 DIAGNOSIS — N189 Chronic kidney disease, unspecified: Secondary | ICD-10-CM | POA: Diagnosis not present

## 2017-05-10 DIAGNOSIS — I10 Essential (primary) hypertension: Secondary | ICD-10-CM | POA: Diagnosis not present

## 2017-05-10 DIAGNOSIS — R002 Palpitations: Secondary | ICD-10-CM | POA: Diagnosis not present

## 2017-05-10 DIAGNOSIS — I25118 Atherosclerotic heart disease of native coronary artery with other forms of angina pectoris: Secondary | ICD-10-CM | POA: Diagnosis not present

## 2017-05-27 DIAGNOSIS — M791 Myalgia, unspecified site: Secondary | ICD-10-CM | POA: Diagnosis not present

## 2017-06-14 DIAGNOSIS — I129 Hypertensive chronic kidney disease with stage 1 through stage 4 chronic kidney disease, or unspecified chronic kidney disease: Secondary | ICD-10-CM | POA: Diagnosis not present

## 2017-06-14 DIAGNOSIS — I25118 Atherosclerotic heart disease of native coronary artery with other forms of angina pectoris: Secondary | ICD-10-CM | POA: Diagnosis not present

## 2017-06-14 DIAGNOSIS — E785 Hyperlipidemia, unspecified: Secondary | ICD-10-CM | POA: Diagnosis not present

## 2017-06-14 DIAGNOSIS — N189 Chronic kidney disease, unspecified: Secondary | ICD-10-CM | POA: Diagnosis not present

## 2017-06-20 DIAGNOSIS — I10 Essential (primary) hypertension: Secondary | ICD-10-CM | POA: Diagnosis not present

## 2017-06-20 DIAGNOSIS — Z13220 Encounter for screening for lipoid disorders: Secondary | ICD-10-CM | POA: Diagnosis not present

## 2017-06-20 DIAGNOSIS — Z131 Encounter for screening for diabetes mellitus: Secondary | ICD-10-CM | POA: Diagnosis not present

## 2017-06-27 DIAGNOSIS — Z0289 Encounter for other administrative examinations: Secondary | ICD-10-CM

## 2017-06-28 NOTE — Progress Notes (Deleted)
[facility]  PATIENT: Cesar Munoz DOB: 1965-05-24   REASON FOR VISIT: *** HISTORY FROM:    HISTORY OF PRESENT ILLNESS: 12/29/16 Cesar Munoz is a 52 y.o. male here as a referral from Dr. Duanne Guess for headache.  Past medical history of migraines (5 years ago with migraines), hypertension, stroke, myocardial infarction. Residual right-sided numbness from previous stroke.He presented to ED 12/14/2016 with headache, left-sided numbness and weakness with facial droop and right eye blurry vision. Has happened multiple times in the past. Migrainous features include pulsating, light and sound sensitivity, unilateral headache, nausea, no vomiting.NIHSS 1.  Best corrected 20/20. Doesn't think he snores, doesn't really wake up with headaches. No excessive fatigue during the day or nodding off. He started having headaches in July, he has not had a headache since November. There was a lot of stress. He has had multiple episodes. Starts on the right, pulsating, pounding, throbbing, light sensitivity, sound sensitivity, sound bothered him, decreased appetite, it was so severe he couldn't drive, the right eye had vision loss, He had right-sided weakness and facial droop. Once the headache went away, the symptoms resolved. They can last days.  No other focal neurologic deficits, associated symptoms, inciting events or modifiable factors.   REVIEW OF SYSTEMS: Full 14 system review of systems performed and notable only for those listed, all others are neg:  Constitutional: neg  Cardiovascular: neg Ear/Nose/Throat: neg  Skin: neg Eyes: neg Respiratory: neg Gastroitestinal: neg  Hematology/Lymphatic: neg  Endocrine: neg Musculoskeletal:neg Allergy/Immunology: neg Neurological: neg Psychiatric: neg Sleep : neg   ALLERGIES: No Known Allergies  HOME MEDICATIONS: Outpatient Medications Prior to Visit  Medication Sig Dispense Refill  . acetaminophen (TYLENOL 8 HOUR) 650 MG CR tablet  Take 1 tablet (650 mg total) by mouth every 8 (eight) hours as needed for pain. 30 tablet 0  . amLODipine (NORVASC) 5 MG tablet TAKE 5 MG BY MOUTH DAILY  5  . butalbital-acetaminophen-caffeine (FIORICET, ESGIC) 50-325-40 MG tablet Take 1 tablet by mouth every 6 (six) hours as needed for headache. Do not exceed more than 10 doses in one month 10 tablet 3  . diclofenac (CATAFLAM) 50 MG tablet Take immediately at onset of headache. May take up to three times a day. 30 tablet 6  . fluticasone (FLONASE) 50 MCG/ACT nasal spray Place into both nostrils daily.    Marland Kitchen olmesartan (BENICAR) 40 MG tablet Take 40 mg by mouth daily.  0   No facility-administered medications prior to visit.     PAST MEDICAL HISTORY: Past Medical History:  Diagnosis Date  . Hypertension   . MI (myocardial infarction) (HCC)   . Migraine   . Stroke [facility])     PAST SURGICAL HISTORY: Past Surgical History:  Procedure Laterality Date  . HAND SURGERY Right     FAMILY HISTORY: Family History  Problem Relation Age of Onset  . Migraines Neg Hx     SOCIAL HISTORY: Social History   Socioeconomic History  . Marital status: Married    Spouse name: Not on file  . Number of children: Not on file  . Years of education: Not on file  . Highest education level: Not on file  Occupational History  . Not on file  Social Needs  . Financial resource strain: Not on file  . Food insecurity:    Worry: Not on file    Inability: Not on file  . Transportation needs:    Medical: Not on file    Non-medical: Not on file  Tobacco Use  . Smoking status: Never Smoker  . Smokeless tobacco: Never Used  Substance and Sexual Activity  . Alcohol use: No  . Drug use: No  . Sexual activity: Not on file  Lifestyle  . Physical activity:    Days per week: Not on file    Minutes per session: Not on file  . Stress: Not on file  Relationships  . Social connections:    Talks on phone: Not on file    Gets together: Not on file     Attends religious service: Not on file    Active member of club or organization: Not on file    Attends meetings of clubs or organizations: Not on file    Relationship status: Not on file  . Intimate partner violence:    Fear of current or ex partner: Not on file    Emotionally abused: Not on file    Physically abused: Not on file    Forced sexual activity: Not on file  Other Topics Concern  . Not on file  Social History Narrative  . Not on file     PHYSICAL EXAM  There were no vitals filed for this visit. There is no height or weight on file to calculate BMI.  Generalized: Well developed, in no acute distress  Head: normocephalic and atraumatic,. Oropharynx benign  Neck: Supple, no carotid bruits  Cardiac: Regular rate rhythm, no murmur  Musculoskeletal: No deformity   Neurological examination   Mentation: Alert oriented to time, place, history taking. Attention span and concentration appropriate. Recent and remote memory intact.  Follows all commands speech and language fluent.   Cranial nerve II-XII: Fundoscopic exam reveals sharp disc margins.Pupils were equal round reactive to light extraocular movements were full, visual field were full on confrontational test. Facial sensation and strength were normal. hearing was intact to finger rubbing bilaterally. Uvula tongue midline. head turning and shoulder shrug were normal and symmetric.Tongue protrusion into cheek strength was normal. Motor: normal bulk and tone, full strength in the BUE, BLE, fine finger movements normal, no pronator drift. No focal weakness Sensory: normal and symmetric to light touch, pinprick, and  Vibration, proprioception  Coordination: finger-nose-finger, heel-to-shin bilaterally, no dysmetria Reflexes: Brachioradialis 2/2, biceps 2/2, triceps 2/2, patellar 2/2, Achilles 2/2, plantar responses were flexor bilaterally. Gait and Station: Rising up from seated position without assistance, normal stance,   moderate stride, good arm swing, smooth turning, able to perform tiptoe, and heel walking without difficulty. Tandem gait is steady  DIAGNOSTIC DATA (LABS, IMAGING, TESTING) - I reviewed patient records, labs, notes, testing and imaging myself where available.  Lab Results  Component Value Date   WBC 4.6 12/14/2016   HGB 16.7 12/14/2016   HCT 49.0 12/14/2016   MCV 89.3 12/14/2016   PLT 196 12/14/2016      Component Value Date/Time   NA 141 03/05/2017 0820   K 4.2 03/05/2017 0820   CL 107 03/05/2017 0820   CO2 25 03/05/2017 0820   GLUCOSE 113 (H) 03/05/2017 0820   BUN 10 03/05/2017 0820   CREATININE 1.44 (H) 03/05/2017 0820   CALCIUM 8.8 (L) 03/05/2017 0820   PROT 6.5 03/05/2017 0820   ALBUMIN 3.6 03/05/2017 0820   AST 16 03/05/2017 0820   ALT 25 03/05/2017 0820   ALKPHOS 77 03/05/2017 0820   BILITOT 0.7 03/05/2017 0820   GFRNONAA 54 (L) 03/05/2017 0820   GFRAA >60 03/05/2017 0820   Lab Results  Component Value Date   CHOL  192 03/05/2017   HDL 43 03/05/2017   LDLCALC 134 (H) 03/05/2017   TRIG 76 03/05/2017   CHOLHDL 4.5 03/05/2017   ASSESSMENT AND PLAN  52 y.o. year old male  has a past medical history of Hypertension, MI (myocardial infarction) (HCC), Migraine, and Stroke (HCC). 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 Lie down and place a cool compress on your head.   Avoid headache triggers. If certain foods or odors seem to have triggered your migraines in the past, avoid them. A headache diary might help you identify triggers.   Include  physical activity in your daily routine. Try a daily walk or other moderate aerobic exercise.   Manage stress. Find healthy ways to cope with the stressors, such as delegating tasks on your to-do list.   Practice relaxation techniques. Try deep breathing, yoga, massage and visualization.   Eat regularly. Eating regularly scheduled meals and maintaining a healthy diet might help prevent headaches. Also, drink plenty of fluids.   Follow a regular sleep schedule. Sleep deprivation might contribute to headaches  Consider biofeedback. With this mind-body technique, you learn to control certain bodily functions - such as muscle tension, heart rate and blood pressure - to prevent headaches or reduce headache pai    Nilda Riggs, [facility], [facility], APRN  [facility] [address], [address] [place], [place] [address] [phone]

## 2017-06-29 ENCOUNTER — Ambulatory Visit: Payer: PRIVATE HEALTH INSURANCE | Admitting: Nurse Practitioner

## 2017-07-02 ENCOUNTER — Encounter: Payer: Self-pay | Admitting: Nurse Practitioner

## 2017-07-04 ENCOUNTER — Other Ambulatory Visit: Payer: Self-pay | Admitting: Nephrology

## 2017-07-04 DIAGNOSIS — N183 Chronic kidney disease, stage 3 unspecified: Secondary | ICD-10-CM

## 2017-07-04 DIAGNOSIS — I129 Hypertensive chronic kidney disease with stage 1 through stage 4 chronic kidney disease, or unspecified chronic kidney disease: Secondary | ICD-10-CM | POA: Diagnosis not present

## 2017-07-09 ENCOUNTER — Ambulatory Visit
Admission: RE | Admit: 2017-07-09 | Discharge: 2017-07-09 | Disposition: A | Discharge status: Home / Self Care | Payer: BLUE CROSS/BLUE SHIELD | Source: Ambulatory Visit | Attending: Nephrology | Admitting: Nephrology

## 2017-07-09 DIAGNOSIS — N183 Chronic kidney disease, stage 3 unspecified: Secondary | ICD-10-CM

## 2017-07-09 DIAGNOSIS — I219 Acute myocardial infarction, unspecified: Secondary | ICD-10-CM | POA: Insufficient documentation

## 2017-07-09 DIAGNOSIS — N281 Cyst of kidney, acquired: Secondary | ICD-10-CM | POA: Diagnosis not present

## 2017-07-09 DIAGNOSIS — I639 Cerebral infarction, unspecified: Secondary | ICD-10-CM | POA: Insufficient documentation

## 2017-07-27 DIAGNOSIS — M791 Myalgia, unspecified site: Secondary | ICD-10-CM | POA: Diagnosis not present

## 2017-08-27 DIAGNOSIS — M791 Myalgia, unspecified site: Secondary | ICD-10-CM | POA: Diagnosis not present

## 2017-09-14 DIAGNOSIS — N189 Chronic kidney disease, unspecified: Secondary | ICD-10-CM | POA: Diagnosis not present

## 2017-09-14 DIAGNOSIS — J309 Allergic rhinitis, unspecified: Secondary | ICD-10-CM | POA: Diagnosis not present

## 2017-09-14 DIAGNOSIS — I10 Essential (primary) hypertension: Secondary | ICD-10-CM | POA: Diagnosis not present

## 2017-09-14 DIAGNOSIS — I25118 Atherosclerotic heart disease of native coronary artery with other forms of angina pectoris: Secondary | ICD-10-CM | POA: Diagnosis not present

## 2017-09-17 DIAGNOSIS — Z6835 Body mass index (BMI) 35.0-35.9, adult: Secondary | ICD-10-CM | POA: Diagnosis not present

## 2017-09-17 DIAGNOSIS — R062 Wheezing: Secondary | ICD-10-CM | POA: Diagnosis not present

## 2017-09-17 DIAGNOSIS — J309 Allergic rhinitis, unspecified: Secondary | ICD-10-CM | POA: Diagnosis not present

## 2017-09-27 DIAGNOSIS — J302 Other seasonal allergic rhinitis: Secondary | ICD-10-CM | POA: Diagnosis not present

## 2017-09-27 DIAGNOSIS — Z6835 Body mass index (BMI) 35.0-35.9, adult: Secondary | ICD-10-CM | POA: Diagnosis not present

## 2017-09-27 DIAGNOSIS — M25561 Pain in right knee: Secondary | ICD-10-CM | POA: Diagnosis not present

## 2017-09-27 DIAGNOSIS — M791 Myalgia, unspecified site: Secondary | ICD-10-CM | POA: Diagnosis not present

## 2017-10-02 DIAGNOSIS — J343 Hypertrophy of nasal turbinates: Secondary | ICD-10-CM | POA: Diagnosis not present

## 2017-10-02 DIAGNOSIS — J310 Chronic rhinitis: Secondary | ICD-10-CM | POA: Diagnosis not present

## 2017-10-23 DIAGNOSIS — Z0271 Encounter for disability determination: Secondary | ICD-10-CM

## 2017-10-25 NOTE — H&P (Signed)
HPI:   Chief Complaint  Patient presents with  . Lesion on lip & sinisus   Cesar Munoz is a 52 y.o. male who presents as a new patient for lip lesion and nasal congestion. Lip lesion has been present 1-2 years. He is not sure how it came up. Denies trauma. Located on the lower lip. It does not bother him and has not increased in size.  Denies pain, bleeding, swelling or infection. He does see a dentist regularly.   He uses topical Nasacort or Nasonex for nasal congestion and snoring. Congestion is bilateral and year round. There is improvement in breathing after using the steroid spray but he does not use it daily. No use of topical decongestant sprays.  He had a sleep study previously through the [employer] and he does not have obstructive sleep apnea. No history of recurrent sinus infections. No formal allergy testing. [place]  No fever, facial pressure/pain, purulent rhinorrhea, dental pain, acute vision changes or cervical lymphadenopathy.  Non-smoker.  PMH/Meds/All/SocHx/FamHx/ROS:   Past Medical History:  Diagnosis Date  . Hypertension   Past Surgical History:  Procedure Laterality Date  . WRIST SURGERY   No family history of bleeding disorders, wound healing problems or difficulty with anesthesia.   Social History   Social History  . Marital status: Unknown  Spouse name: N/A  . Number of children: N/A  . Years of education: N/A   Occupational History  . Not on file.   Social History Main Topics  . Smoking status: Never Smoker  . Smokeless tobacco: Never Used  . Alcohol use Yes  Comment: Social  . Drug use: No  . Sexual activity: Not on file   Other Topics Concern  . Not on file   Social History Narrative  . No narrative on file   Current Outpatient Prescriptions:  . amLODIPine (NORVASC) 5 MG tablet, TK 1 T PO QD, Disp: , Rfl:  . aspirin 81 MG EC tablet *ANTIPLATELET*, TAKE 1 TABLET BY MOUTH EVERY DAY, Disp: , Rfl: 3 .  lisinopril (PRINIVIL,ZESTRIL) 5 [address] , Rfl:  . nitroglycerin (NITCyndee BrigKoreah20mtSun Mi(579)[place] Fle Symmetric tonsils.  Neck No abnormal cervical lymphadenopathy. No thyromegaly. No thyroid masses palpated.  Cardio-vascular No cyanosis.  Pulmonary No audible stridor. Breathing easily with no labor.  Neuro Symmetric facial movement.  Psychiatry Appropriate affect and mood for clinic visit.   Independent Review of Additional Tests or Records:  Medical records.  Procedures:  None  Impression &  Plans:  Cesar Munoz is a 52 y.o. 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 Discussed options of monitoring the lesion versus in-office surgical excision with one of the doctors. Patient will think about this and call back to schedule.  For chronic rhinitis, recommend proper and consistent use of the steroid nasal spray. Prescribed Fluticasone, 2 puffs each nostril QHS. Use it daily for maximum benefit. He may also consider taking  a daily oral anti-histamine like Zyrtec or Allegra. Try this for six weeks. Would have his wife observe if snoring is improved at all. If symptoms persist despite medical management, bilateral inferior turbinate reduction may be an option.  Follow up in six weeks, patient agrees with the plan.

## 2017-10-27 DIAGNOSIS — M791 Myalgia, unspecified site: Secondary | ICD-10-CM | POA: Diagnosis not present

## 2017-10-29 ENCOUNTER — Encounter (HOSPITAL_BASED_OUTPATIENT_CLINIC_OR_DEPARTMENT_OTHER): Payer: Self-pay | Admitting: *Deleted

## 2017-10-29 ENCOUNTER — Other Ambulatory Visit: Payer: Self-pay

## 2017-11-04 NOTE — Anesthesia Preprocedure Evaluation (Addendum)
Anesthesia Evaluation  Patient identified by MRN, date of birth, ID band Patient awake    Reviewed: Allergy & Precautions, NPO status , Patient's Chart, lab work & pertinent test results  Airway Mallampati: III  TM Distance: >3 FB Neck ROM: Full  Mouth opening: Limited Mouth Opening  Dental no notable dental hx. (+) Teeth Intact, Dental Advisory Given   Pulmonary neg pulmonary ROS,    Pulmonary exam normal breath sounds clear to auscultation       Cardiovascular hypertension, Pt. on medications + Past MI (2014)  Normal cardiovascular exam Rhythm:Regular Rate:Normal  Stress Test 02/2017 1. No reversible ischemia or infarction. 2. Normal left ventricular wall motion. 3. Left ventricular ejection fraction 46% 4. Non invasive risk stratification*: Intermediate, on the basis of low ejection fraction.   Neuro/Psych  Headaches, CVA, No Residual Symptoms negative psych ROS   GI/Hepatic negative GI ROS, Neg liver ROS,   Endo/Other  negative endocrine ROS  Renal/GU Renal diseaseCKD Stage 3     Musculoskeletal negative musculoskeletal ROS (+)   Abdominal   Peds  Hematology negative hematology ROS (+)   Anesthesia Other Findings Nasal turbinate hypertrophy  Reproductive/Obstetrics                           Anesthesia Physical Anesthesia Plan  ASA: III  Anesthesia Plan: General   Post-op Pain Management:    Induction: Intravenous  PONV Risk Score and Plan: 2 and Dexamethasone, Ondansetron and Midazolam  Airway Management Planned: Oral ETT  Additional Equipment:   Intra-op Plan:   Post-operative Plan: Extubation in OR  Informed Consent: I have reviewed the patients History and Physical, chart, labs and discussed the procedure including the risks, benefits and alternatives for the proposed anesthesia with the patient or authorized representative who has indicated his/her understanding and  acceptance.   Dental advisory given  Plan Discussed with: CRNA  Anesthesia Plan Comments:        Anesthesia Quick Evaluation

## 2017-11-05 ENCOUNTER — Ambulatory Visit (HOSPITAL_BASED_OUTPATIENT_CLINIC_OR_DEPARTMENT_OTHER): Payer: BLUE CROSS/BLUE SHIELD | Admitting: Anesthesiology

## 2017-11-05 ENCOUNTER — Encounter (HOSPITAL_BASED_OUTPATIENT_CLINIC_OR_DEPARTMENT_OTHER)
Admission: RE | Disposition: A | Discharge status: Home / Self Care | Payer: Self-pay | Source: Ambulatory Visit | Attending: Otolaryngology

## 2017-11-05 ENCOUNTER — Ambulatory Visit (HOSPITAL_BASED_OUTPATIENT_CLINIC_OR_DEPARTMENT_OTHER)
Admission: RE | Admit: 2017-11-05 | Discharge: 2017-11-05 | Disposition: A | Discharge status: Home / Self Care | Payer: BLUE CROSS/BLUE SHIELD | Source: Ambulatory Visit | Attending: Otolaryngology | Admitting: Otolaryngology

## 2017-11-05 ENCOUNTER — Other Ambulatory Visit: Payer: Self-pay

## 2017-11-05 ENCOUNTER — Encounter (HOSPITAL_BASED_OUTPATIENT_CLINIC_OR_DEPARTMENT_OTHER): Payer: Self-pay | Admitting: *Deleted

## 2017-11-05 DIAGNOSIS — J343 Hypertrophy of nasal turbinates: Secondary | ICD-10-CM | POA: Diagnosis not present

## 2017-11-05 DIAGNOSIS — Z7982 Long term (current) use of aspirin: Secondary | ICD-10-CM | POA: Diagnosis not present

## 2017-11-05 DIAGNOSIS — Z79899 Other long term (current) drug therapy: Secondary | ICD-10-CM | POA: Insufficient documentation

## 2017-11-05 DIAGNOSIS — J310 Chronic rhinitis: Secondary | ICD-10-CM | POA: Diagnosis not present

## 2017-11-05 DIAGNOSIS — I10 Essential (primary) hypertension: Secondary | ICD-10-CM | POA: Insufficient documentation

## 2017-11-05 DIAGNOSIS — I252 Old myocardial infarction: Secondary | ICD-10-CM | POA: Insufficient documentation

## 2017-11-05 HISTORY — PX: NASAL TURBINATE REDUCTION: SHX2072

## 2017-11-05 HISTORY — DX: Dyspnea, unspecified: R06.00

## 2017-11-05 HISTORY — DX: Chronic kidney disease, unspecified: N18.9

## 2017-11-05 SURGERY — EXCISION, NASAL TURBINATE, SUBMUCOSAL
Anesthesia: General | Site: Nose | Laterality: Bilateral

## 2017-11-05 MED ORDER — BACITRACIN ZINC 500 UNIT/GM EX OINT
TOPICAL_OINTMENT | CUTANEOUS | Status: DC | PRN
  Administered 2017-11-05: 1 via TOPICAL

## 2017-11-05 MED ORDER — MIDAZOLAM HCL 2 MG/2ML IJ SOLN
1.0000 mg | INTRAMUSCULAR | Status: DC | PRN
  Administered 2017-11-05: 2 mg via INTRAVENOUS

## 2017-11-05 MED ORDER — SUCCINYLCHOLINE CHLORIDE 200 MG/10ML IV SOSY
PREFILLED_SYRINGE | INTRAVENOUS | Status: AC
  Filled 2017-11-05: qty 10

## 2017-11-05 MED ORDER — LIDOCAINE 2% (20 MG/ML) 5 ML SYRINGE
INTRAMUSCULAR | Status: AC
  Filled 2017-11-05: qty 5

## 2017-11-05 MED ORDER — LIDOCAINE-EPINEPHRINE 1 %-1:100000 IJ SOLN
INTRAMUSCULAR | Status: DC | PRN
  Administered 2017-11-05: 10 mL

## 2017-11-05 MED ORDER — PROPOFOL 500 MG/50ML IV EMUL
INTRAVENOUS | Status: AC
  Filled 2017-11-05: qty 50

## 2017-11-05 MED ORDER — OXYMETAZOLINE HCL 0.05 % NA SOLN
NASAL | Status: DC | PRN
  Administered 2017-11-05: 1

## 2017-11-05 MED ORDER — MIDAZOLAM HCL 2 MG/2ML IJ SOLN
INTRAMUSCULAR | Status: AC
  Filled 2017-11-05: qty 2

## 2017-11-05 MED ORDER — LIDOCAINE HCL (CARDIAC) PF 100 MG/5ML IV SOSY
PREFILLED_SYRINGE | INTRAVENOUS | Status: DC | PRN
  Administered 2017-11-05: 100 mg via INTRAVENOUS

## 2017-11-05 MED ORDER — ONDANSETRON HCL 4 MG/2ML IJ SOLN
INTRAMUSCULAR | Status: DC | PRN
  Administered 2017-11-05: 4 mg via INTRAVENOUS

## 2017-11-05 MED ORDER — HYDROCODONE-ACETAMINOPHEN 7.5-325 MG PO TABS: 1.0000 | ORAL_TABLET | Freq: Four times a day (QID) | ORAL | 0 refills | Status: AC | PRN

## 2017-11-05 MED ORDER — FENTANYL CITRATE (PF) 100 MCG/2ML IJ SOLN
50.0000 ug | INTRAMUSCULAR | Status: DC | PRN
  Administered 2017-11-05: 100 ug via INTRAVENOUS

## 2017-11-05 MED ORDER — SUCCINYLCHOLINE CHLORIDE 20 MG/ML IJ SOLN
INTRAMUSCULAR | Status: DC | PRN
  Administered 2017-11-05: 160 mg via INTRAVENOUS

## 2017-11-05 MED ORDER — DEXAMETHASONE SODIUM PHOSPHATE 4 MG/ML IJ SOLN
INTRAMUSCULAR | Status: DC | PRN
  Administered 2017-11-05: 10 mg via INTRAVENOUS

## 2017-11-05 MED ORDER — FENTANYL CITRATE (PF) 100 MCG/2ML IJ SOLN
INTRAMUSCULAR | Status: AC
  Filled 2017-11-05: qty 2

## 2017-11-05 MED ORDER — SCOPOLAMINE 1 MG/3DAYS TD PT72: 1.0000 | MEDICATED_PATCH | Freq: Once | TRANSDERMAL | Status: DC | PRN

## 2017-11-05 MED ORDER — OXYMETAZOLINE HCL 0.05 % NA SOLN
2.0000 | NASAL | Status: DC
  Administered 2017-11-05: 2 via NASAL

## 2017-11-05 MED ORDER — PROPOFOL 10 MG/ML IV BOLUS
INTRAVENOUS | Status: DC | PRN
  Administered 2017-11-05: 150 mg via INTRAVENOUS

## 2017-11-05 MED ORDER — ONDANSETRON HCL 4 MG/2ML IJ SOLN
INTRAMUSCULAR | Status: AC
  Filled 2017-11-05: qty 2

## 2017-11-05 MED ORDER — LACTATED RINGERS IV SOLN
INTRAVENOUS | Status: DC
  Administered 2017-11-05: 07:00:00 via INTRAVENOUS

## 2017-11-05 MED ORDER — OXYMETAZOLINE HCL 0.05 % NA SOLN
NASAL | Status: AC
  Filled 2017-11-05: qty 15

## 2017-11-05 MED ORDER — DEXAMETHASONE SODIUM PHOSPHATE 10 MG/ML IJ SOLN
INTRAMUSCULAR | Status: AC
  Filled 2017-11-05: qty 1

## 2017-11-05 MED ORDER — FENTANYL CITRATE (PF) 100 MCG/2ML IJ SOLN: 25.0000 ug | INTRAMUSCULAR | Status: DC | PRN

## 2017-11-05 SURGICAL SUPPLY — 20 items
CANISTER SUCT 1200ML W/VALVE (MISCELLANEOUS) ×3 IMPLANT
COVER WAND RF STERILE (DRAPES) IMPLANT
DECANTER SPIKE VIAL GLASS SM (MISCELLANEOUS) IMPLANT
GAUZE 4X4 16PLY RFD (DISPOSABLE) IMPLANT
GLOVE ECLIPSE 7.5 STRL STRAW (GLOVE) ×3 IMPLANT
GOWN STRL REUS W/ TWL LRG LVL3 (GOWN DISPOSABLE) ×1 IMPLANT
GOWN STRL REUS W/ TWL XL LVL3 (GOWN DISPOSABLE) ×1 IMPLANT
GOWN STRL REUS W/TWL LRG LVL3 (GOWN DISPOSABLE) ×2
GOWN STRL REUS W/TWL XL LVL3 (GOWN DISPOSABLE) ×2
HEMOSTAT SURGICEL .5X2 ABSORB (HEMOSTASIS) IMPLANT
NEEDLE PRECISIONGLIDE 27X1.5 (NEEDLE) ×3 IMPLANT
NS IRRIG 1000ML POUR BTL (IV SOLUTION) ×3 IMPLANT
PACK BASIN DAY SURGERY FS (CUSTOM PROCEDURE TRAY) ×3 IMPLANT
PACK ENT DAY SURGERY (CUSTOM PROCEDURE TRAY) ×3 IMPLANT
PATTIES SURGICAL .5 X3 (DISPOSABLE) ×3 IMPLANT
SPONGE GAUZE 2X2 8PLY STER LF (GAUZE/BANDAGES/DRESSINGS) ×1
SPONGE GAUZE 2X2 8PLY STRL LF (GAUZE/BANDAGES/DRESSINGS) ×2 IMPLANT
SUT ETHILON 3 0 PS 1 (SUTURE) IMPLANT
TOWEL GREEN STERILE FF (TOWEL DISPOSABLE) ×3 IMPLANT
YANKAUER SUCT BULB TIP NO VENT (SUCTIONS) ×3 IMPLANT

## 2017-11-05 NOTE — Interval H&P Note (Signed)
History and Physical Interval Note:  11/05/2017 7:16 AM  Cesar Munoz  has presented today for surgery, with the diagnosis of Nasal Turbinate Hypertrophy  The various methods of treatment have been discussed with the patient and family. After consideration of risks, benefits and other options for treatment, the patient has consented to  Procedure(s): Inferior TURBINATE REDUCTION/SUBMUCOSAL RESECTION (Bilateral) as a surgical intervention .  The patient's history has been reviewed, patient examined, no change in status, stable for surgery.  I have reviewed the patient's chart and labs.  Questions were answered to the patient's satisfaction.     Serena Colonel

## 2017-11-05 NOTE — Transfer of Care (Signed)
Immediate Anesthesia Transfer of Care Note  Patient: Cesar Munoz  Procedure(s) Performed: Inferior TURBINATE REDUCTION/SUBMUCOSAL RESECTION (Bilateral Nose)  Patient Location: PACU  Anesthesia Type:General  Level of Consciousness: awake, sedated and patient cooperative  Airway & Oxygen Therapy: Patient Spontanous Breathing and aerosol face mask  Post-op Assessment: Report given to RN and Post -op Vital signs reviewed and stable  Post vital signs: Reviewed and stable  Last Vitals:  Vitals Value Taken Time  BP    Temp    Pulse 66 11/05/2017  8:13 AM  Resp 0 11/05/2017  8:13 AM  SpO2 100 % 11/05/2017  8:13 AM    Last Pain:  Vitals:   11/05/17 0650  TempSrc: Oral  PainSc: 0-No pain         Complications: No apparent anesthesia complications

## 2017-11-05 NOTE — Progress Notes (Signed)
Pt called after going home from surgery today, reported bleeding from nose saturating one gauze every 5 min. Communicated this to Dr. Pollyann Kennedy, who stated this is expected and to give it some more time.   This RN reassured pt and instructed him to call the office later today if bleeding has not improved.

## 2017-11-05 NOTE — Op Note (Signed)
OPERATIVE REPORT  DATE OF SURGERY: 11/05/2017  PATIENT:  Cesar Munoz,  52 y.o. male  PRE-OPERATIVE DIAGNOSIS:  Nasal Turbinate Hypertrophy  POST-OPERATIVE DIAGNOSIS:  Nasal Turbinate Hypertrophy  PROCEDURE:  Procedure(s): Inferior TURBINATE REDUCTION/SUBMUCOSAL RESECTION  SURGEON:  Susy Frizzle, MD  ASSISTANTS: none  ANESTHESIA:   General   EBL:  10 ml  DRAINS: none  LOCAL MEDICATIONS USED:  1% Xylocaine with epinephrine  SPECIMEN:  none  COUNTS:  Correct  PROCEDURE DETAILS: The patient was taken to the operating room and placed on the operating table in the supine position. Following induction of general endotracheal anesthesia, the nose was prepped and draped in a standard fashion. Afrin spray was used preoperatively in the holding area. 1% Xylocaine with epinephrine was infiltrated into the septum, the columella, and the inferior turbinates bilaterally.    Submucous resection inferior turbinates bilaterally. The leading edge of the inferior turbinates were incised in a vertical fashion with a #15 scalpel. The Cottle elevator was used to elevate mucosa off bone in all directions. Fragments of the turbinate bone were resected with a Takahashi forceps. The turbinate remnants were outfractured with the Therapist, nutritional.  These maneuvers greatly increased the nasal airways bilaterally. The nasal cavities were packed with rolled up Telfa coated with bacitracin ointment. The pharynx was suctioned of blood and secretions under direct visualization. The patient was awakened extubated and transferred to recovery in stable condition.    PATIENT DISPOSITION:  To PACU, stable

## 2017-11-05 NOTE — Anesthesia Procedure Notes (Signed)
Procedure Name: Intubation Date/Time: 11/05/2017 7:40 AM Performed by: Gar Gibbon, CRNA Pre-anesthesia Checklist: Patient identified, Emergency Drugs available, Suction available and Patient being monitored Patient Re-evaluated:Patient Re-evaluated prior to induction Oxygen Delivery Method: Circle system utilized Preoxygenation: Pre-oxygenation with 100% oxygen Induction Type: IV induction Ventilation: Mask ventilation without difficulty Laryngoscope Size: Glidescope Grade View: Grade II Tube type: Oral Rae Tube size: 8.0 mm Number of attempts: 1 Airway Equipment and Method: Stylet and Oral airway Placement Confirmation: ETT inserted through vocal cords under direct vision,  positive ETCO2 and breath sounds checked- equal and bilateral Tube secured with: Tape Dental Injury: Teeth and Oropharynx as per pre-operative assessment

## 2017-11-05 NOTE — Discharge Instructions (Signed)
Make an appointment with Dr. Pollyann Kennedy for Thursday to have packing removed.   Call Dr. Pollyann Kennedy for excessive bleeding. If change Drip pad( soaked) x 3 in an hour - call  Dr Pollyann Kennedy.     Post Anesthesia Home Care Instructions  Activity: Get plenty of rest for the remainder of the day. A responsible individual must stay with you for 24 hours following the procedure.  For the next 24 hours, DO NOT: -Drive a car -Advertising copywriter -Drink alcoholic beverages -Take any medication unless instructed by your physician -Make any legal decisions or sign important papers.  Meals: Start with liquid foods such as gelatin or soup. Progress to regular foods as tolerated. Avoid greasy, spicy, heavy foods. If nausea and/or vomiting occur, drink only clear liquids until the nausea and/or vomiting subsides. Call your physician if vomiting continues.  Special Instructions/Symptoms: Your throat may feel dry or sore from the anesthesia or the breathing tube placed in your throat during surgery. If this causes discomfort, gargle with warm salt water. The discomfort should disappear within 24 hours.  If you had a scopolamine patch placed behind your ear for the management of post- operative nausea and/or vomiting:  1. The medication in the patch is effective for 72 hours, after which it should be removed.  Wrap patch in a tissue and discard in the trash. Wash hands thoroughly with soap and water. 2. You may remove the patch earlier than 72 hours if you experience unpleasant side effects which may include dry mouth, dizziness or visual disturbances. 3. Avoid touching the patch. Wash your hands with soap and water after contact with the patch.

## 2017-11-05 NOTE — Anesthesia Postprocedure Evaluation (Signed)
Anesthesia Post Note  Patient: Cesar Munoz  Procedure(s) Performed: Inferior TURBINATE REDUCTION/SUBMUCOSAL RESECTION (Bilateral Nose)     Patient location during evaluation: PACU Anesthesia Type: General Level of consciousness: awake and alert Pain management: pain level controlled Vital Signs Assessment: post-procedure vital signs reviewed and stable Respiratory status: spontaneous breathing, nonlabored ventilation, respiratory function stable and patient connected to nasal cannula oxygen Cardiovascular status: blood pressure returned to baseline and stable Postop Assessment: no apparent nausea or vomiting Anesthetic complications: no    Last Vitals:  Vitals:   11/05/17 0845 11/05/17 0946  BP: 137/87 (!) 143/96  Pulse: 66 62  Resp: 13 18  Temp:  36.4 C  SpO2: 100% 100%    Last Pain:  Vitals:   11/05/17 0946  TempSrc:   PainSc: 0-No pain                 Ashly Goethe L Cristella Stiver

## 2017-11-06 ENCOUNTER — Encounter (HOSPITAL_BASED_OUTPATIENT_CLINIC_OR_DEPARTMENT_OTHER): Payer: Self-pay | Admitting: Otolaryngology

## 2017-11-13 DIAGNOSIS — N183 Chronic kidney disease, stage 3 (moderate): Secondary | ICD-10-CM | POA: Diagnosis not present

## 2017-11-13 DIAGNOSIS — I129 Hypertensive chronic kidney disease with stage 1 through stage 4 chronic kidney disease, or unspecified chronic kidney disease: Secondary | ICD-10-CM | POA: Diagnosis not present

## 2017-12-19 DIAGNOSIS — N189 Chronic kidney disease, unspecified: Secondary | ICD-10-CM | POA: Diagnosis not present

## 2017-12-19 DIAGNOSIS — I208 Other forms of angina pectoris: Secondary | ICD-10-CM | POA: Diagnosis not present

## 2017-12-19 DIAGNOSIS — I10 Essential (primary) hypertension: Secondary | ICD-10-CM | POA: Diagnosis not present

## 2017-12-19 DIAGNOSIS — R0609 Other forms of dyspnea: Secondary | ICD-10-CM | POA: Diagnosis not present

## 2018-01-01 DIAGNOSIS — Z114 Encounter for screening for human immunodeficiency virus [HIV]: Secondary | ICD-10-CM | POA: Diagnosis not present

## 2018-01-01 DIAGNOSIS — I10 Essential (primary) hypertension: Secondary | ICD-10-CM | POA: Diagnosis not present

## 2018-01-01 DIAGNOSIS — Z0000 Encounter for general adult medical examination without abnormal findings: Secondary | ICD-10-CM | POA: Diagnosis not present

## 2018-01-01 DIAGNOSIS — I208 Other forms of angina pectoris: Secondary | ICD-10-CM | POA: Diagnosis not present

## 2018-01-01 DIAGNOSIS — Z1211 Encounter for screening for malignant neoplasm of colon: Secondary | ICD-10-CM | POA: Diagnosis not present

## 2018-01-01 DIAGNOSIS — Z6835 Body mass index (BMI) 35.0-35.9, adult: Secondary | ICD-10-CM | POA: Diagnosis not present

## 2018-01-01 DIAGNOSIS — R0609 Other forms of dyspnea: Secondary | ICD-10-CM | POA: Diagnosis not present

## 2018-01-01 DIAGNOSIS — Z13220 Encounter for screening for lipoid disorders: Secondary | ICD-10-CM | POA: Diagnosis not present

## 2018-01-01 DIAGNOSIS — Z8673 Personal history of transient ischemic attack (TIA), and cerebral infarction without residual deficits: Secondary | ICD-10-CM | POA: Diagnosis not present

## 2018-01-01 DIAGNOSIS — Z1329 Encounter for screening for other suspected endocrine disorder: Secondary | ICD-10-CM | POA: Diagnosis not present

## 2018-01-01 DIAGNOSIS — Z125 Encounter for screening for malignant neoplasm of prostate: Secondary | ICD-10-CM | POA: Diagnosis not present

## 2018-01-21 DIAGNOSIS — I208 Other forms of angina pectoris: Secondary | ICD-10-CM | POA: Diagnosis not present

## 2018-01-21 DIAGNOSIS — E785 Hyperlipidemia, unspecified: Secondary | ICD-10-CM | POA: Diagnosis not present

## 2018-01-21 DIAGNOSIS — Z8673 Personal history of transient ischemic attack (TIA), and cerebral infarction without residual deficits: Secondary | ICD-10-CM | POA: Diagnosis not present

## 2018-01-21 DIAGNOSIS — I10 Essential (primary) hypertension: Secondary | ICD-10-CM | POA: Diagnosis not present

## 2018-02-08 DIAGNOSIS — I10 Essential (primary) hypertension: Secondary | ICD-10-CM | POA: Diagnosis not present

## 2018-02-08 DIAGNOSIS — R7303 Prediabetes: Secondary | ICD-10-CM | POA: Diagnosis not present

## 2018-02-08 DIAGNOSIS — R51 Headache: Secondary | ICD-10-CM | POA: Diagnosis not present

## 2018-02-08 DIAGNOSIS — N289 Disorder of kidney and ureter, unspecified: Secondary | ICD-10-CM | POA: Diagnosis not present

## 2018-02-18 DIAGNOSIS — J309 Allergic rhinitis, unspecified: Secondary | ICD-10-CM | POA: Diagnosis not present

## 2018-02-18 DIAGNOSIS — E785 Hyperlipidemia, unspecified: Secondary | ICD-10-CM | POA: Diagnosis not present

## 2018-02-18 DIAGNOSIS — I208 Other forms of angina pectoris: Secondary | ICD-10-CM | POA: Diagnosis not present

## 2018-02-18 DIAGNOSIS — I10 Essential (primary) hypertension: Secondary | ICD-10-CM | POA: Diagnosis not present

## 2018-03-19 DIAGNOSIS — I208 Other forms of angina pectoris: Secondary | ICD-10-CM | POA: Diagnosis not present

## 2018-03-19 DIAGNOSIS — J309 Allergic rhinitis, unspecified: Secondary | ICD-10-CM | POA: Diagnosis not present

## 2018-03-19 DIAGNOSIS — E785 Hyperlipidemia, unspecified: Secondary | ICD-10-CM | POA: Diagnosis not present

## 2018-03-19 DIAGNOSIS — I10 Essential (primary) hypertension: Secondary | ICD-10-CM | POA: Diagnosis not present

## 2018-06-13 DIAGNOSIS — E785 Hyperlipidemia, unspecified: Secondary | ICD-10-CM | POA: Diagnosis not present

## 2018-06-13 DIAGNOSIS — J309 Allergic rhinitis, unspecified: Secondary | ICD-10-CM | POA: Diagnosis not present

## 2018-06-13 DIAGNOSIS — I10 Essential (primary) hypertension: Secondary | ICD-10-CM | POA: Diagnosis not present

## 2018-06-13 DIAGNOSIS — I208 Other forms of angina pectoris: Secondary | ICD-10-CM | POA: Diagnosis not present

## 2018-09-13 DIAGNOSIS — J309 Allergic rhinitis, unspecified: Secondary | ICD-10-CM | POA: Diagnosis not present

## 2018-09-13 DIAGNOSIS — E785 Hyperlipidemia, unspecified: Secondary | ICD-10-CM | POA: Diagnosis not present

## 2018-09-13 DIAGNOSIS — I10 Essential (primary) hypertension: Secondary | ICD-10-CM | POA: Diagnosis not present

## 2018-09-13 DIAGNOSIS — I208 Other forms of angina pectoris: Secondary | ICD-10-CM | POA: Diagnosis not present

## 2018-09-16 DIAGNOSIS — E785 Hyperlipidemia, unspecified: Secondary | ICD-10-CM | POA: Diagnosis not present

## 2018-09-16 DIAGNOSIS — I10 Essential (primary) hypertension: Secondary | ICD-10-CM | POA: Diagnosis not present

## 2018-10-07 DIAGNOSIS — N183 Chronic kidney disease, stage 3 (moderate): Secondary | ICD-10-CM | POA: Diagnosis not present

## 2018-10-07 DIAGNOSIS — E669 Obesity, unspecified: Secondary | ICD-10-CM | POA: Diagnosis not present

## 2018-10-07 DIAGNOSIS — I129 Hypertensive chronic kidney disease with stage 1 through stage 4 chronic kidney disease, or unspecified chronic kidney disease: Secondary | ICD-10-CM | POA: Diagnosis not present

## 2018-10-07 DIAGNOSIS — I2510 Atherosclerotic heart disease of native coronary artery without angina pectoris: Secondary | ICD-10-CM | POA: Diagnosis not present

## 2018-11-15 ENCOUNTER — Other Ambulatory Visit (HOSPITAL_COMMUNITY): Payer: Self-pay | Admitting: Cardiology

## 2018-11-15 ENCOUNTER — Other Ambulatory Visit: Payer: Self-pay | Admitting: Cardiology

## 2018-11-15 DIAGNOSIS — J309 Allergic rhinitis, unspecified: Secondary | ICD-10-CM | POA: Diagnosis not present

## 2018-11-15 DIAGNOSIS — I200 Unstable angina: Secondary | ICD-10-CM | POA: Diagnosis not present

## 2018-11-15 DIAGNOSIS — N189 Chronic kidney disease, unspecified: Secondary | ICD-10-CM | POA: Diagnosis not present

## 2018-11-15 DIAGNOSIS — R079 Chest pain, unspecified: Secondary | ICD-10-CM

## 2018-11-15 DIAGNOSIS — E785 Hyperlipidemia, unspecified: Secondary | ICD-10-CM | POA: Diagnosis not present

## 2018-11-25 ENCOUNTER — Other Ambulatory Visit: Payer: Self-pay

## 2018-11-25 ENCOUNTER — Ambulatory Visit (HOSPITAL_COMMUNITY)
Admission: RE | Admit: 2018-11-25 | Discharge: 2018-11-25 | Disposition: A | Discharge status: Home / Self Care | Payer: BC Managed Care – PPO | Source: Ambulatory Visit | Attending: Cardiology | Admitting: Cardiology

## 2018-11-25 DIAGNOSIS — R079 Chest pain, unspecified: Secondary | ICD-10-CM | POA: Diagnosis not present

## 2018-11-25 DIAGNOSIS — R0789 Other chest pain: Secondary | ICD-10-CM | POA: Diagnosis not present

## 2018-11-25 MED ORDER — REGADENOSON 0.4 MG/5ML IV SOLN
INTRAVENOUS | Status: AC
  Filled 2018-11-25: qty 5

## 2018-11-25 MED ORDER — TECHNETIUM TC 99M TETROFOSMIN IV KIT
9.9000 | PACK | Freq: Once | INTRAVENOUS | Status: AC | PRN
  Administered 2018-11-25: 12:00:00 9.9 via INTRAVENOUS

## 2018-11-25 MED ORDER — REGADENOSON 0.4 MG/5ML IV SOLN
0.4000 mg | Freq: Once | INTRAVENOUS | Status: AC
  Administered 2018-11-25: 12:00:00 0.4 mg via INTRAVENOUS

## 2018-11-25 MED ORDER — TECHNETIUM TC 99M TETROFOSMIN IV KIT
30.0000 | PACK | Freq: Once | INTRAVENOUS | Status: AC | PRN
  Administered 2018-11-25: 30 via INTRAVENOUS

## 2018-11-28 DIAGNOSIS — I208 Other forms of angina pectoris: Secondary | ICD-10-CM | POA: Diagnosis not present

## 2018-11-28 DIAGNOSIS — N189 Chronic kidney disease, unspecified: Secondary | ICD-10-CM | POA: Diagnosis not present

## 2018-11-28 DIAGNOSIS — I10 Essential (primary) hypertension: Secondary | ICD-10-CM | POA: Diagnosis not present

## 2018-11-28 DIAGNOSIS — E785 Hyperlipidemia, unspecified: Secondary | ICD-10-CM | POA: Diagnosis not present

## 2019-07-24 ENCOUNTER — Other Ambulatory Visit (HOSPITAL_BASED_OUTPATIENT_CLINIC_OR_DEPARTMENT_OTHER): Payer: Self-pay

## 2019-07-24 DIAGNOSIS — R5383 Other fatigue: Secondary | ICD-10-CM

## 2019-07-24 DIAGNOSIS — G4710 Hypersomnia, unspecified: Secondary | ICD-10-CM

## 2019-07-24 DIAGNOSIS — R0683 Snoring: Secondary | ICD-10-CM

## 2019-08-16 ENCOUNTER — Encounter (HOSPITAL_BASED_OUTPATIENT_CLINIC_OR_DEPARTMENT_OTHER): Payer: BC Managed Care – PPO | Admitting: Internal Medicine

## 2019-11-08 ENCOUNTER — Encounter (HOSPITAL_BASED_OUTPATIENT_CLINIC_OR_DEPARTMENT_OTHER): Payer: Self-pay | Admitting: Internal Medicine

## 2019-11-29 IMAGING — US US CAROTID DUPLEX BILAT
1 series · 13 of 24 positions shown · non-contrast
Comparison: No prior duplex

CLINICAL DATA: 51-year-old male with a history of TIA.

Cardiovascular risk factors include hypertension, known prior
stroke/TIA
EXAM:
BILATERAL CAROTID DUPLEX ULTRASOUND
TECHNIQUE: Gray scale imaging, color Doppler and duplex ultrasound were
performed of bilateral carotid and vertebral arteries in the neck.

[Series 1: us carotid duplex bilat · 0.08mm/px · 13 of 54 slices shown]
[im 1/54]
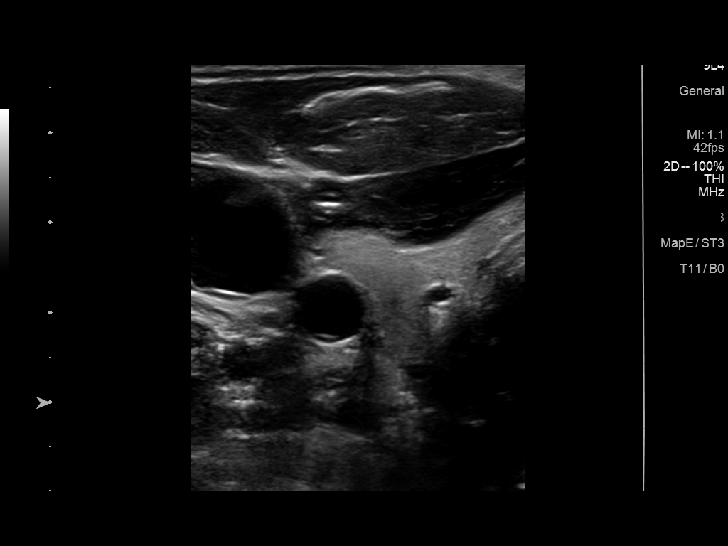
[im 5/54]
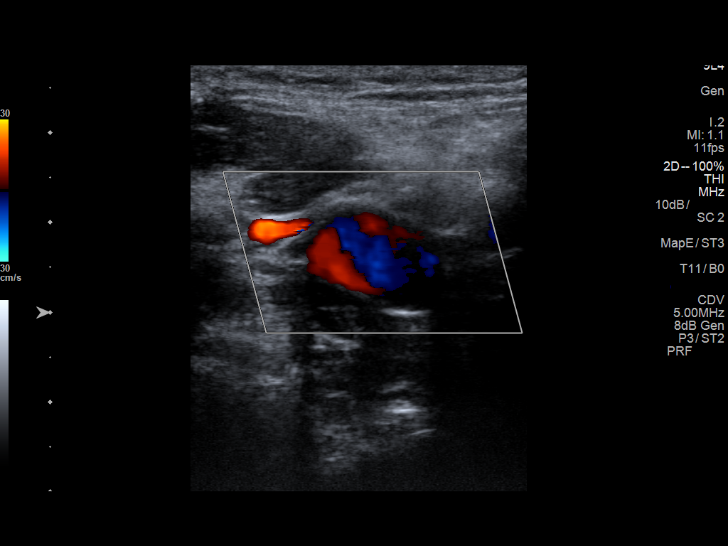
[im 10/54]
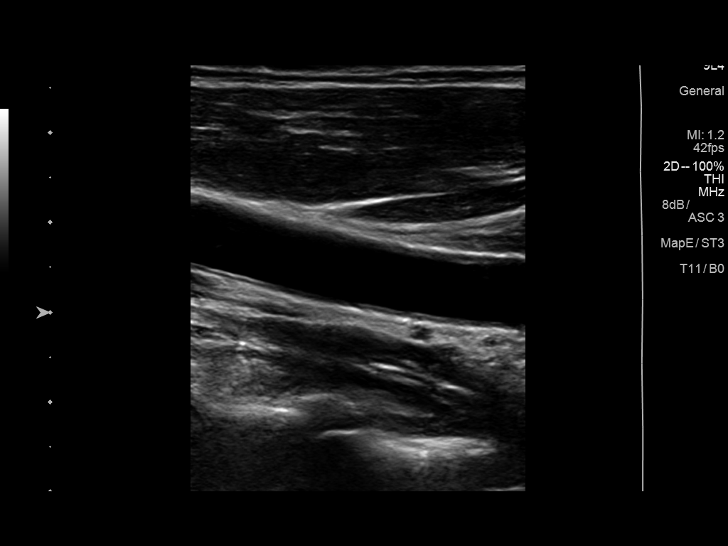
[im 14/54]
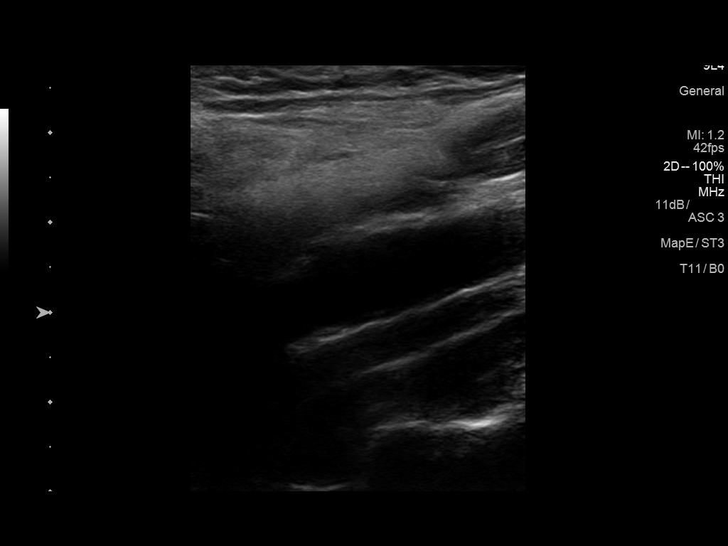
[im 19/54]
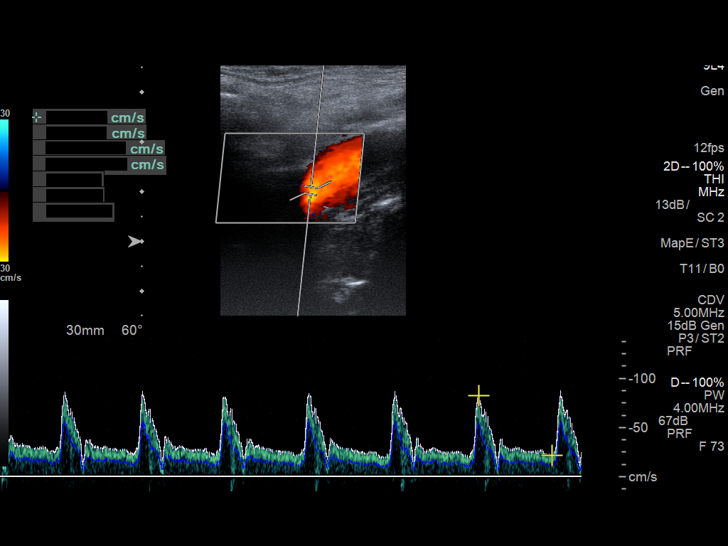
[im 24/54]
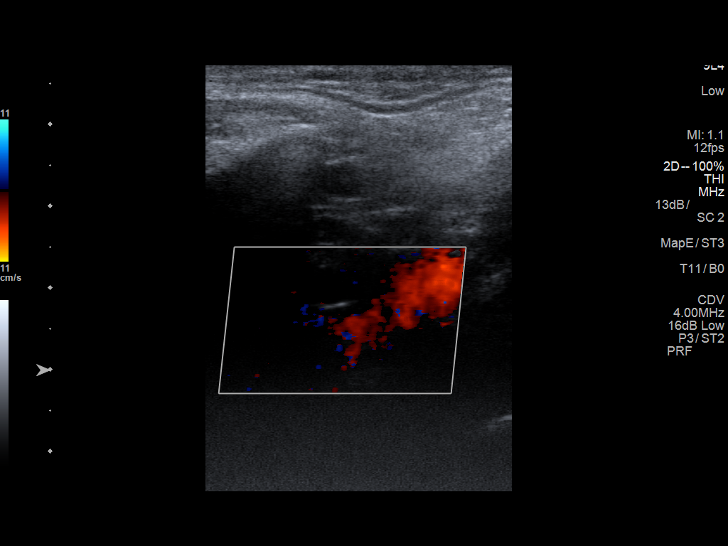
[im 28/54]
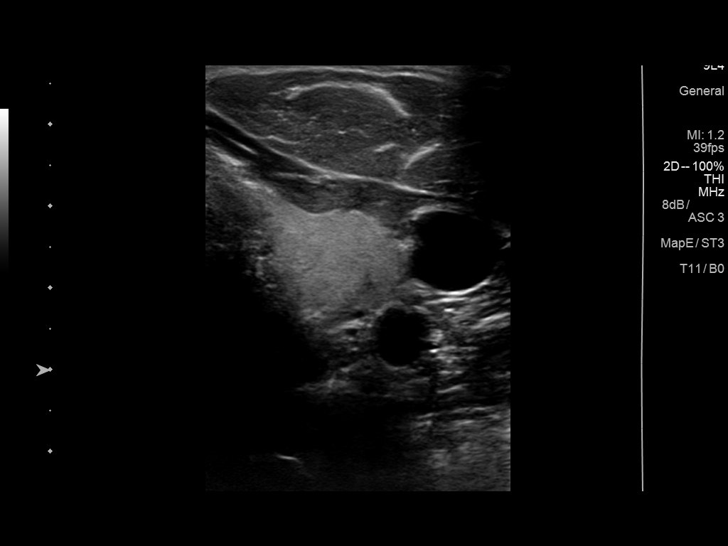
[im 30/54]
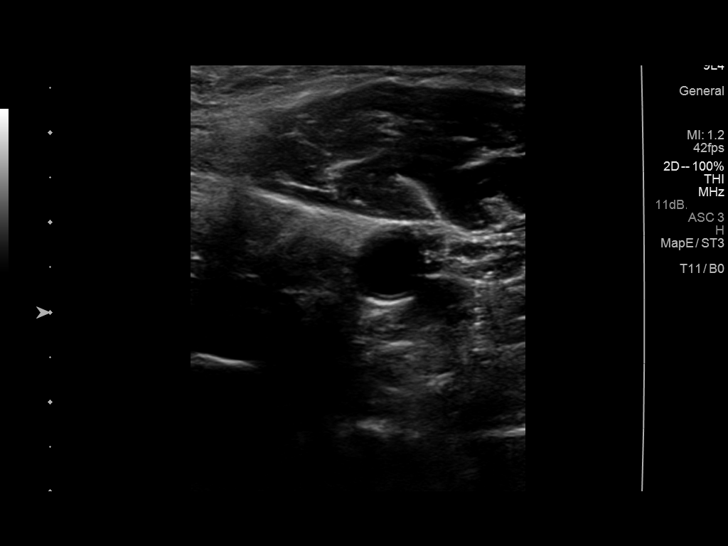
[im 35/54]
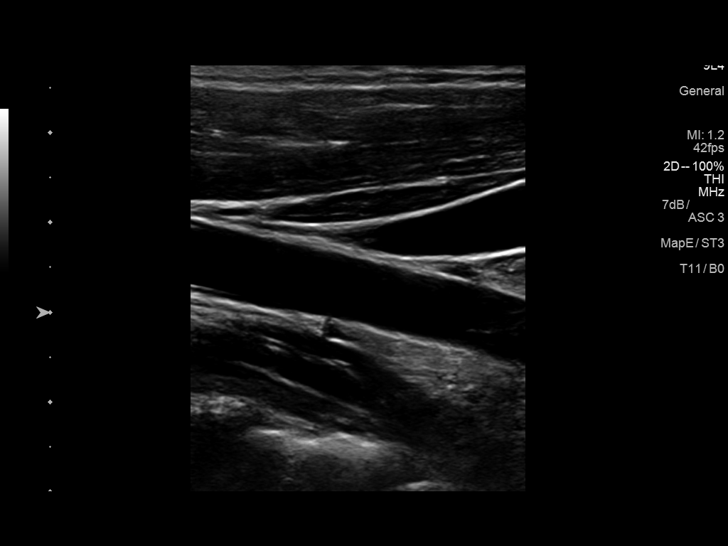
[im 40/54]
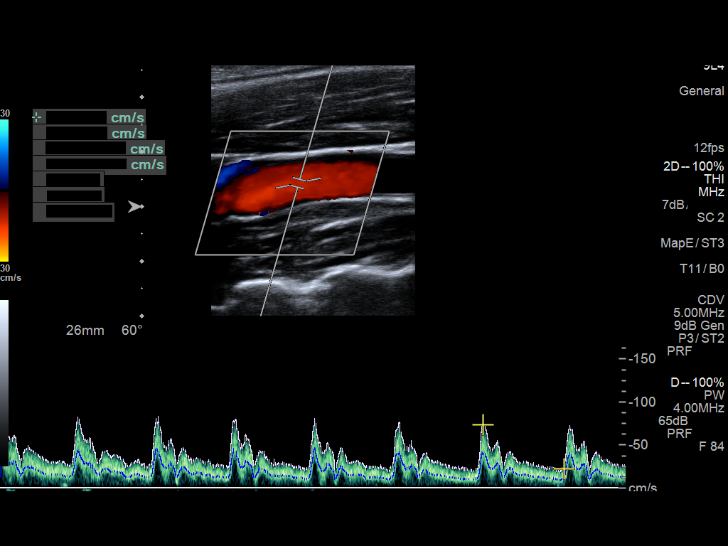
[im 44/54]
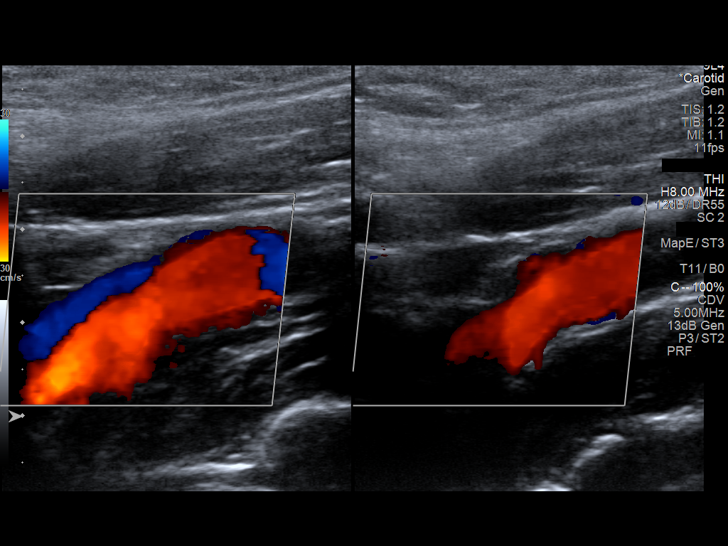
[im 49/54]
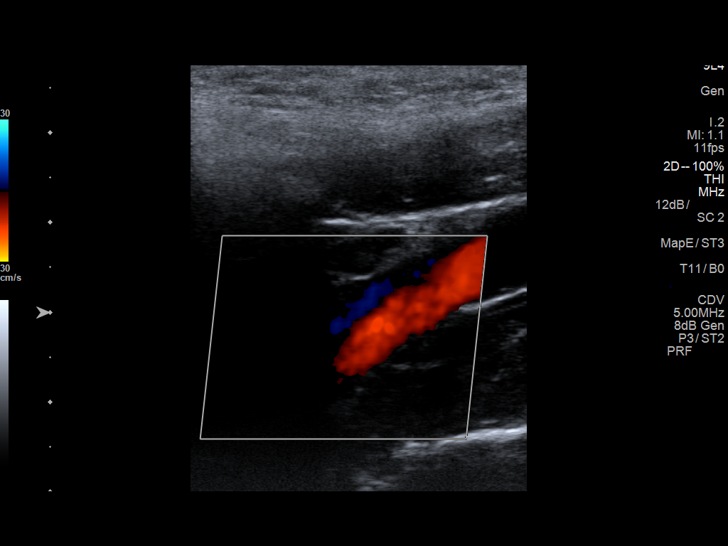
[im 54/54]
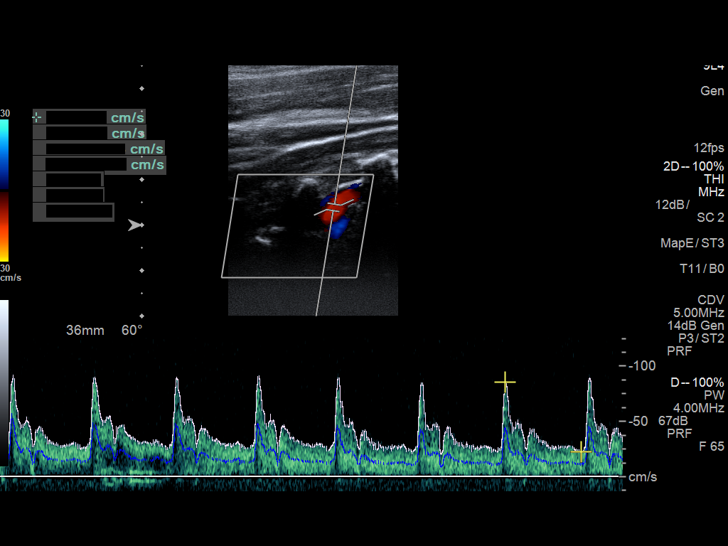

[13 of 24 positions shown; findings below may reference images not displayed]

FINDINGS: Criteria: Quantification of carotid stenosis is based on velocity
parameters that correlate the residual internal carotid diameter
with NASCET-based stenosis levels, using the diameter of the distal
internal carotid lumen as the denominator for stenosis measurement.

The following velocity measurements were obtained:

RIGHT

ICA:  Systolic 82 cm/sec, Diastolic 23 cm/sec

CCA:  119 cm/sec

SYSTOLIC ICA/CCA RATIO:

ECA:  82 cm/sec

LEFT

ICA:  Systolic 86 cm/sec, Diastolic 23 cm/sec

CCA:  131 cm/sec

SYSTOLIC ICA/CCA RATIO:

ECA:  64 cm/sec

Right Brachial SBP: 152

Left Brachial SBP: 150

RIGHT CAROTID ARTERY: No significant calcifications of the right
common carotid artery. Intermediate waveform maintained.
Heterogeneous and partially calcified plaque at the right carotid
bifurcation. No significant lumen shadowing. Low resistance waveform
of the right ICA. No significant tortuosity.

RIGHT VERTEBRAL ARTERY: Antegrade flow with low resistance waveform.

LEFT CAROTID ARTERY: No significant calcifications of the left
common carotid artery. Intermediate waveform maintained.
Heterogeneous and partially calcified plaque at the left carotid
bifurcation without significant lumen shadowing. Low resistance
waveform of the left ICA. No significant tortuosity.

LEFT VERTEBRAL ARTERY:  Antegrade flow with low resistance waveform.
IMPRESSION: Color duplex indicates minimal heterogeneous and calcified plaque,
with no hemodynamically significant stenosis by duplex criteria in
the extracranial cerebrovascular circulation.

## 2020-09-22 ENCOUNTER — Other Ambulatory Visit: Payer: Self-pay | Admitting: Cardiology

## 2020-09-22 DIAGNOSIS — R079 Chest pain, unspecified: Secondary | ICD-10-CM

## 2020-09-29 ENCOUNTER — Encounter (HOSPITAL_COMMUNITY): Payer: Self-pay

## 2020-10-01 ENCOUNTER — Encounter (HOSPITAL_COMMUNITY)
Admission: RE | Admit: 2020-10-01 | Discharge: 2020-10-01 | Disposition: A | Discharge status: Home / Self Care | Payer: Managed Care, Other (non HMO) | Source: Ambulatory Visit | Attending: Cardiology | Admitting: Cardiology

## 2020-10-01 ENCOUNTER — Other Ambulatory Visit: Payer: Self-pay

## 2020-10-01 DIAGNOSIS — R079 Chest pain, unspecified: Secondary | ICD-10-CM | POA: Diagnosis present

## 2020-10-01 MED ORDER — REGADENOSON 0.4 MG/5ML IV SOLN
INTRAVENOUS | Status: AC
  Administered 2020-10-01: 0.4 mg via INTRAVENOUS
  Filled 2020-10-01: qty 5

## 2020-10-01 MED ORDER — REGADENOSON 0.4 MG/5ML IV SOLN: 0.4000 mg | Freq: Once | INTRAVENOUS | Status: AC

## 2020-10-01 MED ORDER — TECHNETIUM TC 99M TETROFOSMIN IV KIT
30.9000 | PACK | Freq: Once | INTRAVENOUS | Status: AC | PRN
  Administered 2020-10-01: 30.9 via INTRAVENOUS

## 2020-10-01 MED ORDER — TECHNETIUM TC 99M TETROFOSMIN IV KIT
11.0000 | PACK | Freq: Once | INTRAVENOUS | Status: AC | PRN
  Administered 2020-10-01: 11 via INTRAVENOUS

## 2023-02-12 NOTE — Progress Notes (Signed)
 Subjective:     Patient ID: Cesar Munoz is a 58 y.o. male.     Chief Complaint: Abdominal Pain (Pt states that he has had abdominal pain 3 weeks )    PHQ-2 Score: 0  History of Present Illness: Patient is a 58 yo male presenting for 3 weeks of abdominal pain. He denies any injuries or falls. He denies any fevers, chills, shortness of breath, nausea, vomiting, or diarrhea. He reports that his pain worsens when sitting up. He has tried taking Tylenol  for his symptoms with minimal improvement in symptoms.  Abdominal Pain Associated symptoms include myalgias. Pertinent negatives include no diarrhea, nausea or vomiting.   Outpatient Medications Prior to Visit  Medication Sig Dispense Refill  . amLODIPine (NORVASC) 10 MG tablet Take 1 tablet (10 mg total) by mouth daily.    . carvediloL (COREG) 6.25 MG tablet     . cholecalciferol, vitamin D3, 50 mcg (2,000 unit) cap TAKE 1 CAP BY MOUTH DAILY FOR 1 MO    . rosuvastatin (CRESTOR) 10 MG tablet Take 1 tablet (10 mg total) by mouth daily.    SABRA aspirin 81 MG chewable tablet Chew 1 tablet (81 mg total) daily at 3:00pm. (Patient not taking: Reported on 02/12/2023)    . azelastine (ASTELIN) 137 mcg (0.1 %) nasal spray 1 spray into each nostril 2 (two) times a day. (Patient not taking: Reported on 02/12/2023)    . butalbital -acetaminophen -caffeine  (ESGIC) 50-325-40 mg per tablet Take 1 tablet by mouth every 6 (six) hours as needed for headaches. (Patient not taking: Reported on 02/12/2023)    . clopidogreL (PLAVIX) 75 mg tablet TAKE ONE TABLET BY MOUTH DAILY WITH FOOD (Patient not taking: Reported on 02/12/2023)    . diclofenac  (CATAFLAM ) 50 MG tablet Take 1 tablet (50 mg total) by mouth as needed (pain). (Patient not taking: Reported on 02/12/2023)    . fluticasone propionate (FLONASE) 50 mcg/actuation nasal spray into each nostril. (Patient not taking: Reported on 02/12/2023)    . isosorbide mononitrate ER (IMDUR) 30 MG 24 hr tablet Take 1 tablet (30  mg total) by mouth daily. (Patient not taking: Reported on 02/12/2023)    . losartan (COZAAR) 100 MG tablet Take 1 tablet (100 mg total) by mouth daily. (Patient not taking: Reported on 02/12/2023)    . nitroglycerin (NITROSTAT) 0.4 MG SL tablet Place 1 tablet (0.4 mg total) under the tongue every 5 (five) minutes as needed for chest pain. (Patient not taking: Reported on 02/12/2023)    . omega-3s/dha/epa/fish oil/D3 (VITAMIN-D + OMEGA-3 ORAL) Take by mouth. (Patient not taking: Reported on 02/12/2023)     No facility-administered medications prior to visit.    Review of Systems  Constitutional: Negative.   HENT: Negative.    Eyes: Negative.   Respiratory: Negative.    Cardiovascular: Negative.   Gastrointestinal:  Positive for abdominal pain. Negative for diarrhea, nausea, rectal pain and vomiting.  Endocrine: Negative.   Genitourinary: Negative.   Musculoskeletal:  Positive for myalgias.  Skin: Negative.   Allergic/Immunologic: Negative.   Neurological: Negative.   Hematological: Negative.   Psychiatric/Behavioral: Negative.       Objective:    BP 144/76 (BP Location: Right upper arm, BP Position: Sitting, Cuff Size: Large Adult ([place]))   Pulse 66   Temp 97.8 F (36.6 C) (Temporal)   Resp 18   Wt (!) 112.8 kg (248 lb 9.6 oz)   SpO2 98%   BMI 37.25 kg/m      Physical Exam Vitals  and nursing note reviewed.  Constitutional:      Appearance: Normal appearance. He is obese.  HENT:     Head: Normocephalic and atraumatic.     Right Ear: External ear normal.     Left Ear: External ear normal.     Nose: Nose normal.     Mouth/Throat:     Mouth: Mucous membranes are moist.     Pharynx: Oropharynx is clear.  Eyes:     Extraocular Movements: Extraocular movements intact.     Conjunctiva/sclera: Conjunctivae normal.  Cardiovascular:     Rate and Rhythm: Normal rate and regular rhythm.     Pulses: Normal pulses.     Heart sounds: Normal heart sounds.  Pulmonary:      Effort: Pulmonary effort is normal.     Breath sounds: Normal breath sounds.  Abdominal:     General: Abdomen is flat. Bowel sounds are normal.     Palpations: Abdomen is soft.     Tenderness: There is abdominal tenderness (RLQ).  Musculoskeletal:        General: Normal range of motion.     Cervical back: Normal range of motion and neck supple.  Skin:    General: Skin is warm and dry.     Capillary Refill: Capillary refill takes less than 2 seconds.  Neurological:     General: No focal deficit present.     Mental Status: He is alert and oriented to person, place, and time. Mental status is at baseline.  Psychiatric:        Mood and Affect: Mood normal.        Behavior: Behavior normal.        Thought Content: Thought content normal.        Judgment: Judgment normal.      Assessment:    1. Strain of abdominal muscle, initial encounter   2. Right lower quadrant abdominal pain   3. Primary hypertension    No problem-specific Assessment & Plan notes found for this encounter.     Plan:    1. Right lower quadrant abdominal pain See below. CMP pending. - POCT Urinalysis - CMP; Future  2. Strain of abdominal muscle, initial encounter (Primary) Patient is a 58 yo male presenting for evaluation of right lower quadrant pain. Physical examination consistent with abdominal muscle strain, specifically of the abdominal obliques. Discussed treatment options and agreed upon trial of Flexeril for symptomatic relief. Discussed proper rest from exacerbating activities. Return precautions discussed with patient. - cyclobenzaprine (FLEXERIL) 5 MG tablet; Take 1 tablet (5 mg total) by mouth 3 (three) times a day as needed.  Dispense: 30 tablet; Refill: 0  3. Primary hypertension Patient with slightly elevated BP above goal today. Discussed home BP monitoring and follow-up in one month.   Orders Placed This Encounter  Procedures  . 1 Month Follow-up Visit with Primary Care  . CMP  . POCT  Urinalysis   Medications Ordered This Encounter  Medications  . cyclobenzaprine (FLEXERIL) 5 MG tablet    Sig: Take 1 tablet (5 mg total) by mouth 3 (three) times a day as needed.    Dispense:  30 tablet    Refill:  0    Goals   None       Follow-Up:   Return in about 4 weeks (around 03/12/2023), or if symptoms worsen or fail to improve, for Recheck.

## 2023-08-20 NOTE — Progress Notes (Signed)
 Subjective:     Patient ID: Cesar Munoz is a 58 y.o. male.     Chief Complaint: Migraine (Off and on for a long time)    PHQ-2 Score: 0  History of Present Illness: Patient is a 58 year old male presenting for evaluation of on and off headaches for the past several months.  Patient states that he is having headaches 3 days out of the week.  He states that he has had headaches for the past 2 years but it is worsening over the past few months.  He denies any numbness or tingling, nausea, vomiting, or visual disturbances.  He does endorse some dizziness and photosensitivity with his symptoms.  Patient would also like to be referred to an audiologist for a comprehensive hearing test, as he feels that his hearing is decreased in his left ear.  Migraine  Associated symptoms include dizziness and photophobia.   Medications Prior to Visit[1]  Review of Systems  Constitutional: Negative.   HENT: Negative.    Eyes:  Positive for photophobia.  Respiratory: Negative.    Cardiovascular: Negative.   Gastrointestinal: Negative.   Endocrine: Negative.   Genitourinary: Negative.   Musculoskeletal: Negative.   Skin: Negative.   Allergic/Immunologic: Negative.   Neurological:  Positive for dizziness and headaches.  Hematological: Negative.   Psychiatric/Behavioral: Negative.       Objective:    BP 123/67 (BP Location: Left upper arm, BP Position: Sitting, Cuff Size: Large Adult ([place]))   Pulse 65   Temp 97.8 F (36.6 C) (Temporal)   Resp 18   Ht 1.74 m (5' 8.5)   Wt (!) 113.4 kg (250 lb)   SpO2 99%   BMI 37.46 kg/m      Physical Exam Vitals and nursing note reviewed.  Constitutional:      Appearance: Normal appearance. He is normal weight.  HENT:     Head: Normocephalic and atraumatic.     Right Ear: External ear normal.     Left Ear: External ear normal.     Nose: Nose normal.     Mouth/Throat:     Mouth: Mucous membranes are moist.     Pharynx: Oropharynx is  clear.   Eyes:     Extraocular Movements: Extraocular movements intact.     Conjunctiva/sclera: Conjunctivae normal.   Pulmonary:     Effort: Pulmonary effort is normal.   Musculoskeletal:        General: Normal range of motion.     Cervical back: Normal range of motion and neck supple.   Skin:    General: Skin is warm and dry.     Capillary Refill: Capillary refill takes less than 2 seconds.   Neurological:     General: No focal deficit present.     Mental Status: He is alert and oriented to person, place, and time. Mental status is at baseline.   Psychiatric:        Mood and Affect: Mood normal.        Behavior: Behavior normal.        Thought Content: Thought content normal.        Judgment: Judgment normal.      Assessment:    1. Migraine without aura and without status migrainosus, not intractable   2. Decreased hearing of left ear   3. Class 2 obesity due to excess calories without serious comorbidity with body mass index (BMI) of 37.0 to 37.9 in adult    No problem-specific Assessment & Plan notes  found for this encounter.     Plan:    1. Migraine without aura and without status migrainosus, not intractable (Primary) Patient is a 58 year old male presenting for evaluation of right temporal and occipital headaches for the past 2 years, worsening over the past few months.  No focal neurological deficits appreciated on exam.  Suspect patient is having common migraines.  Patient is unable to take abortive therapy due to CKD stage Munoz.  We discussed possible preventative medications, however, some of these are contraindicated in CKD as well.  Patient is already on carvedilol so was hesitant to discuss starting propranolol for migraine prevention.  After comprehensive discussion regarding options, agreed upon referral to neurology for further discussion of options and potentially starting monoclonal antibody/botulinum injections.  I will also obtain a head CT, given that  patient had no history of headaches consistently until about 2 years prior to presentation.  ER and return precautions discussed with patient. - CT Head Without IV Contrast; Future - Referral to Neurology  2. Decreased hearing of left ear Patient requesting referral to audiology.  Referral placed per patient request for comprehensive hearing assessment. - Referral to Audiology  3. Class 2 obesity due to excess calories without serious comorbidity with body mass index (BMI) of 37.0 to 37.9 in adult Patient with BMI over 37, increased from previous.  We discussed the importance of proper diet and exercise.  If patient's weight continues to increase, patient would likely benefit from discussion regarding GLP-1's.   Orders Placed This Encounter  Procedures  . CT Head Without IV Contrast  . Referral to Audiology  . Referral to Neurology      Goals   None       Follow-Up:   Return if symptoms worsen or fail to improve.   In the instance that text in this note was generated using an ambient documentation service, we discussed that a recording device may be used to record and summarize our discussion today. All persons present during the encounter consented to its use.         [1] Outpatient Medications Prior to Visit  Medication Sig Dispense Refill  . amLODIPine (NORVASC) 10 MG tablet Take 1 tablet (10 mg total) by mouth daily.    . butalbital -acetaminophen -caffeine  (ESGIC) 50-325-40 mg per tablet Take 1 tablet by mouth every 6 (six) hours as needed for headaches.    . carvediloL (COREG) 6.25 MG tablet     . cholecalciferol, vitamin D3, 50 mcg (2,000 unit) cap TAKE 1 CAP BY MOUTH DAILY FOR 1 MO    . losartan-hydrochlorothiazide (HYZAAR) 100-12.5 mg per tablet Take 1 tablet by mouth daily.    . nitroglycerin (NITROSTAT) 0.4 MG SL tablet Place 1 tablet (0.4 mg total) under the tongue every 5 (five) minutes as needed for chest pain.    SABRA olmesartan (BENICAR) 40 MG tablet Take 1  tablet (40 mg total) by mouth daily.    SABRA omega-3s/dha/epa/fish oil/D3 (VITAMIN-D + OMEGA-3 ORAL) Take by mouth.    . rosuvastatin (CRESTOR) 10 MG tablet Take 1 tablet (10 mg total) by mouth daily.    . cyclobenzaprine (FLEXERIL) 5 MG tablet Take 1 tablet (5 mg total) by mouth 3 (three) times a day as needed. (Patient not taking: Reported on 08/20/2023) 30 tablet 0  . aspirin 81 MG chewable tablet Chew 1 tablet (81 mg total) daily at 3:00pm. (Patient not taking: Reported on 02/12/2023)    . azelastine (ASTELIN) 137 mcg (0.1 %) nasal spray  1 spray into each nostril 2 (two) times a day. (Patient not taking: Reported on 02/12/2023)    . clopidogreL (PLAVIX) 75 mg tablet TAKE ONE TABLET BY MOUTH DAILY WITH FOOD (Patient not taking: Reported on 02/12/2023)    . diclofenac  (CATAFLAM ) 50 MG tablet Take 1 tablet (50 mg total) by mouth as needed (pain). (Patient not taking: Reported on 02/12/2023)    . fluticasone propionate (FLONASE) 50 mcg/actuation nasal spray into each nostril. (Patient not taking: Reported on 02/12/2023)    . isosorbide mononitrate ER (IMDUR) 30 MG 24 hr tablet Take 1 tablet (30 mg total) by mouth daily. (Patient not taking: Reported on 02/12/2023)    . losartan (COZAAR) 100 MG tablet Take 1 tablet (100 mg total) by mouth daily. (Patient not taking: Reported on 02/12/2023)     No facility-administered medications prior to visit.

## 2023-11-26 NOTE — Progress Notes (Signed)
 Subjective:     Patient ID: Cesar Munoz is a 58 y.o. male.     Chief Complaint: Otalgia (Right ear pain x 2 months)    PHQ-2 Score: 0  History of Present Illness: Patient is a 58 year old male presenting for multiple complaints.  Patient states that he has been having pain in his right ear for the past 2 months.  He denies any injuries or recent illnesses.  He denies any dizziness or lightheadedness.  He denies any falls.  He denies any changes in his hearing, numbness, or weakness.  He has been attempting to clean his ears but states that this has not resolved his pain.  Patient also states he has been having decreased energy levels over the past several months.  He denies any chest pain, shortness of breath, fever, or chills.  He denies any recent unexplained weight loss.  On the subject of weight loss, patient would like to discuss options for adjunctive therapy for weight loss.  Patient states he has been attempting lifestyle modifications via diet and exercise for the past year with minimal success.    Otalgia     Medications Prior to Visit[1]  Review of Systems  Constitutional:  Positive for fatigue.  HENT:  Positive for ear pain.   Eyes: Negative.   Respiratory: Negative.    Cardiovascular: Negative.   Gastrointestinal: Negative.   Endocrine: Negative.   Genitourinary: Negative.   Musculoskeletal: Negative.   Skin: Negative.   Allergic/Immunologic: Negative.   Neurological: Negative.   Hematological: Negative.   Psychiatric/Behavioral: Negative.       Objective:    BP 123/78 (BP Location: Right upper arm, BP Position: Sitting, Cuff Size: Large Adult ([place]))   Pulse 71   Temp 97.6 F (36.4 C) (Temporal)   Ht 1.74 m (5' 8.5)   Wt (!) 114.8 kg (253 lb)   SpO2 98%   BMI 37.91 kg/m      Physical Exam Vitals and nursing note reviewed.  Constitutional:      Appearance: Normal appearance. He is obese.  HENT:     Head: Normocephalic and  atraumatic.     Right Ear: External ear normal. There is impacted cerumen.     Left Ear: External ear normal. There is impacted cerumen.     Nose: Nose normal.     Mouth/Throat:     Mouth: Mucous membranes are moist.     Pharynx: Oropharynx is clear.  Eyes:     Extraocular Movements: Extraocular movements intact.     Conjunctiva/sclera: Conjunctivae normal.  Cardiovascular:     Rate and Rhythm: Normal rate and regular rhythm.     Pulses: Normal pulses.     Heart sounds: Normal heart sounds.  Pulmonary:     Effort: Pulmonary effort is normal.     Breath sounds: Normal breath sounds.  Musculoskeletal:        General: Normal range of motion.     Cervical back: Normal range of motion and neck supple.  Skin:    General: Skin is warm and dry.     Capillary Refill: Capillary refill takes less than 2 seconds.  Neurological:     General: No focal deficit present.     Mental Status: He is alert and oriented to person, place, and time. Mental status is at baseline.  Psychiatric:        Mood and Affect: Mood normal.        Behavior: Behavior normal.  Thought Content: Thought content normal.        Judgment: Judgment normal.      Assessment:    1. Other fatigue   2. Class 2 obesity due to excess calories without serious comorbidity with body mass index (BMI) of 37.0 to 37.9 in adult   3. Bilateral impacted cerumen    No problem-specific Assessment & Plan notes found for this encounter.     Plan:    1. Other fatigue (Primary) Patient is a 58 year old male presenting for evaluation of decreased energy levels for the past several months.  Will evaluate for organic causes of fatigue with comprehensive laboratory assessment.  Will also order sleep study to evaluate for possible OSA as a contributor to patient's current symptoms and presentation.  Will follow-up with patient to discuss results once available.  Discussed ER and return precautions with patient. - CBC with  Differential; Future - CMP; Future - Hemoglobin A1C; Future - TSH with reflex to Free T4; Future - Vitamin D 25-Hydroxy - In House; Future - Vitamin B12; Future  2. Class 2 obesity due to excess calories without serious comorbidity with body mass index (BMI) of 37.0 to 37.9 in adult Patient with BMI of 37.9 in clinic.  We discussed options for management and I believe that patient would be a good candidate for [facility] for weight loss.  Patient will utilize [facility] as an adjunct to diet and exercise, which patient has been attempting for the past 12 months with minimal success.  Will follow-up with patient 1 month from starting [facility] to discuss response to treatment. - semaglutide, weight loss, (WEGOVY) 0.25 mg/0.5 mL auto-injector; Inject 0.5 mL (0.25 mg total) under the skin once a week.  Dispense: 4 each; Refill: 0  3. Bilateral impacted cerumen Patient with cerumen impaction bilaterally, which may explain his right ear discomfort.  Cerumen removal performed in clinic with incomplete resolution.  Suggested patient obtain over-the-counter Debrox for additional management of cerumen impaction.  Advised patient to utilize Debrox for the next week and attempt to clean ears on his own.  If he does not have any success, I instructed him to return to clinic for repeat evaluation.  Discussed ER and return precautions with patient. - Ear wax removal   Orders Placed This Encounter  Procedures  . Ear wax removal  . CBC with Differential  . CMP  . Hemoglobin A1C  . TSH with reflex to Free T4  . Vitamin D 25-Hydroxy - In House  . Vitamin B12  . Home Sleep Study   Medications Ordered This Encounter  Medications  . semaglutide, weight loss, (WEGOVY) 0.25 mg/0.5 mL auto-injector    Sig: Inject 0.5 mL (0.25 mg total) under the skin once a week.    Dispense:  4 each    Refill:  0    Goals   None       Follow-Up:   Return if symptoms worsen or fail to improve.   In the instance that text  in this note was generated using an ambient documentation service, we discussed that a recording device may be used to record and summarize our discussion today. All persons present during the encounter consented to its use.        [1] Outpatient Medications Prior to Visit  Medication Sig Dispense Refill  . amLODIPine (NORVASC) 10 MG tablet Take 1 tablet (10 mg total) by mouth daily.    . butalbital -acetaminophen -caffeine  (ESGIC) 50-325-40 mg per tablet Take 1 tablet by mouth  every 6 (six) hours as needed for headaches.    . carvediloL (COREG) 6.25 MG tablet     . cholecalciferol, vitamin D3, 50 mcg (2,000 unit) cap TAKE 1 CAP BY MOUTH DAILY FOR 1 MO    . clopidogreL (PLAVIX) 75 mg tablet     . cyclobenzaprine (FLEXERIL) 5 MG tablet Take 1 tablet (5 mg total) by mouth 3 (three) times a day as needed. 30 tablet 0  . losartan-hydrochlorothiazide (HYZAAR) 100-12.5 mg per tablet Take 1 tablet by mouth daily.    . nitroglycerin (NITROSTAT) 0.4 MG SL tablet Place 1 tablet (0.4 mg total) under the tongue every 5 (five) minutes as needed for chest pain.    SABRA olmesartan (BENICAR) 40 MG tablet Take 1 tablet (40 mg total) by mouth daily.    . rosuvastatin (CRESTOR) 10 MG tablet Take 1 tablet (10 mg total) by mouth daily.    SABRA omega-3s/dha/epa/fish oil/D3 (VITAMIN-D + OMEGA-3 ORAL) Take by mouth. (Patient not taking: Reported on 11/26/2023)     No facility-administered medications prior to visit.
